# Patient Record
Sex: Female | Born: 1970
Health system: Southern US, Community
[De-identification: ages and names within clinical notes are randomized; demographics above are authoritative.]

## PROBLEM LIST (undated history)

## (undated) DIAGNOSIS — C801 Malignant (primary) neoplasm, unspecified: Secondary | ICD-10-CM

## (undated) DIAGNOSIS — G43909 Migraine, unspecified, not intractable, without status migrainosus: Secondary | ICD-10-CM

## (undated) DIAGNOSIS — J45909 Unspecified asthma, uncomplicated: Secondary | ICD-10-CM

---

## 1995-07-05 DIAGNOSIS — C801 Malignant (primary) neoplasm, unspecified: Secondary | ICD-10-CM

## 1995-07-05 HISTORY — DX: Malignant (primary) neoplasm, unspecified: C80.1

## 2003-07-05 HISTORY — PX: MINOR RELEASE DORSAL COMPARTMENT (DEQUERVAINS): SHX5980

## 2003-07-05 HISTORY — PX: OTHER SURGICAL HISTORY: SHX169

## 2004-07-04 HISTORY — PX: NASAL SINUS SURGERY: SHX719

## 2004-07-26 ENCOUNTER — Ambulatory Visit: Payer: Self-pay | Admitting: Orthopaedic Surgery

## 2004-10-03 ENCOUNTER — Emergency Department: Payer: Self-pay | Admitting: Emergency Medicine

## 2004-10-20 ENCOUNTER — Ambulatory Visit: Payer: Self-pay | Admitting: Orthopaedic Surgery

## 2005-03-09 ENCOUNTER — Ambulatory Visit: Payer: Self-pay | Admitting: Orthopaedic Surgery

## 2006-09-11 ENCOUNTER — Emergency Department: Payer: Self-pay | Admitting: Emergency Medicine

## 2006-12-18 ENCOUNTER — Emergency Department: Payer: Self-pay | Admitting: Emergency Medicine

## 2006-12-19 ENCOUNTER — Emergency Department: Payer: Self-pay | Admitting: Emergency Medicine

## 2008-03-11 ENCOUNTER — Emergency Department: Payer: Self-pay | Admitting: Emergency Medicine

## 2008-03-11 ENCOUNTER — Other Ambulatory Visit: Payer: Self-pay

## 2008-10-30 ENCOUNTER — Ambulatory Visit: Payer: Self-pay | Admitting: Family Medicine

## 2009-11-18 ENCOUNTER — Ambulatory Visit: Payer: Self-pay | Admitting: Family Medicine

## 2015-07-30 ENCOUNTER — Observation Stay: Payer: BLUE CROSS/BLUE SHIELD | Admitting: Certified Registered Nurse Anesthetist

## 2015-07-30 ENCOUNTER — Encounter
Admission: EM | Disposition: A | Discharge status: Home / Self Care | Payer: Self-pay | Source: Home / Self Care | Attending: Emergency Medicine

## 2015-07-30 ENCOUNTER — Observation Stay
Admission: EM | Admit: 2015-07-30 | Discharge: 2015-07-31 | Disposition: A | Discharge status: Home / Self Care | Payer: BLUE CROSS/BLUE SHIELD | Attending: Surgery | Admitting: Surgery

## 2015-07-30 ENCOUNTER — Emergency Department: Payer: BLUE CROSS/BLUE SHIELD

## 2015-07-30 DIAGNOSIS — Z9889 Other specified postprocedural states: Secondary | ICD-10-CM | POA: Insufficient documentation

## 2015-07-30 DIAGNOSIS — Z880 Allergy status to penicillin: Secondary | ICD-10-CM | POA: Insufficient documentation

## 2015-07-30 DIAGNOSIS — K829 Disease of gallbladder, unspecified: Secondary | ICD-10-CM | POA: Diagnosis not present

## 2015-07-30 DIAGNOSIS — Z888 Allergy status to other drugs, medicaments and biological substances status: Secondary | ICD-10-CM | POA: Insufficient documentation

## 2015-07-30 DIAGNOSIS — K8010 Calculus of gallbladder with chronic cholecystitis without obstruction: Secondary | ICD-10-CM | POA: Diagnosis not present

## 2015-07-30 DIAGNOSIS — F17200 Nicotine dependence, unspecified, uncomplicated: Secondary | ICD-10-CM | POA: Diagnosis not present

## 2015-07-30 DIAGNOSIS — K810 Acute cholecystitis: Secondary | ICD-10-CM | POA: Diagnosis present

## 2015-07-30 DIAGNOSIS — R1011 Right upper quadrant pain: Secondary | ICD-10-CM

## 2015-07-30 HISTORY — PX: CHOLECYSTECTOMY: SHX55

## 2015-07-30 HISTORY — DX: Migraine, unspecified, not intractable, without status migrainosus: G43.909

## 2015-07-30 LAB — URINALYSIS COMPLETE WITH MICROSCOPIC (ARMC ONLY)
Bilirubin Urine: NEGATIVE
GLUCOSE, UA: NEGATIVE mg/dL
Hgb urine dipstick: NEGATIVE
Ketones, ur: NEGATIVE mg/dL
Leukocytes, UA: NEGATIVE
NITRITE: NEGATIVE
Protein, ur: 30 mg/dL — AB
SPECIFIC GRAVITY, URINE: 1.025 (ref 1.005–1.030)
pH: 8 (ref 5.0–8.0)

## 2015-07-30 LAB — COMPREHENSIVE METABOLIC PANEL
ALBUMIN: 4.3 g/dL (ref 3.5–5.0)
ALK PHOS: 71 U/L (ref 38–126)
ALT: 17 U/L (ref 14–54)
AST: 22 U/L (ref 15–41)
Anion gap: 11 (ref 5–15)
BILIRUBIN TOTAL: 1.1 mg/dL (ref 0.3–1.2)
BUN: 20 mg/dL (ref 6–20)
CO2: 20 mmol/L — AB (ref 22–32)
Calcium: 9 mg/dL (ref 8.9–10.3)
Chloride: 105 mmol/L (ref 101–111)
Creatinine, Ser: 0.53 mg/dL (ref 0.44–1.00)
GFR calc Af Amer: >60 mL/min (ref >60)
GFR calc non Af Amer: >60 mL/min (ref >60)
Glucose, Bld: 134 mg/dL — ABNORMAL HIGH (ref 65–99)
Potassium: 3.5 mmol/L (ref 3.5–5.1)
Sodium: 136 mmol/L (ref 135–145)
TOTAL PROTEIN: 7.6 g/dL (ref 6.5–8.1)

## 2015-07-30 LAB — CBC
HEMATOCRIT: 42.7 % (ref 35.0–47.0)
Hemoglobin: 14.5 g/dL (ref 12.0–16.0)
MCH: 30.2 pg (ref 26.0–34.0)
MCHC: 34.1 g/dL (ref 32.0–36.0)
MCV: 88.7 fL (ref 80.0–100.0)
Platelets: 321 10*3/uL (ref 150–440)
RBC: 4.82 MIL/uL (ref 3.80–5.20)
RDW: 13.3 % (ref 11.5–14.5)
WBC: 13.9 10*3/uL — ABNORMAL HIGH (ref 3.6–11.0)

## 2015-07-30 LAB — PREGNANCY, URINE: Preg Test, Ur: NEGATIVE

## 2015-07-30 LAB — LIPASE, BLOOD: Lipase: 26 U/L (ref 11–51)

## 2015-07-30 SURGERY — LAPAROSCOPIC CHOLECYSTECTOMY
Anesthesia: General

## 2015-07-30 MED ORDER — HYDROMORPHONE HCL 1 MG/ML IJ SOLN
1.0000 mg | INTRAMUSCULAR | Status: DC | PRN
  Administered 2015-07-30 (×2): 1 mg via INTRAVENOUS
  Filled 2015-07-30 (×2): qty 1

## 2015-07-30 MED ORDER — ONDANSETRON 4 MG PO TBDP: 4.0000 mg | ORAL_TABLET | Freq: Three times a day (TID) | ORAL | Status: DC | PRN

## 2015-07-30 MED ORDER — ROCURONIUM BROMIDE 100 MG/10ML IV SOLN
INTRAVENOUS | Status: DC | PRN
  Administered 2015-07-30: 40 mg via INTRAVENOUS
  Administered 2015-07-30: 10 mg via INTRAVENOUS

## 2015-07-30 MED ORDER — IPRATROPIUM-ALBUTEROL 0.5-2.5 (3) MG/3ML IN SOLN
3.0000 mL | Freq: Once | RESPIRATORY_TRACT | Status: AC
  Administered 2015-07-30: 3 mL via RESPIRATORY_TRACT

## 2015-07-30 MED ORDER — OXYCODONE-ACETAMINOPHEN 5-325 MG PO TABS: 1.0000 | ORAL_TABLET | ORAL | Status: DC | PRN

## 2015-07-30 MED ORDER — LIDOCAINE HCL (CARDIAC) 20 MG/ML IV SOLN
INTRAVENOUS | Status: DC | PRN
  Administered 2015-07-30: 60 mg via INTRAVENOUS

## 2015-07-30 MED ORDER — LACTATED RINGERS IV SOLN
INTRAVENOUS | Status: DC
  Administered 2015-07-30 (×2): via INTRAVENOUS

## 2015-07-30 MED ORDER — HYDROMORPHONE HCL 1 MG/ML IJ SOLN
1.0000 mg | Freq: Once | INTRAMUSCULAR | Status: AC
  Administered 2015-07-30: 1 mg via INTRAVENOUS
  Filled 2015-07-30: qty 1

## 2015-07-30 MED ORDER — SODIUM CHLORIDE 0.9 % IJ SOLN
INTRAMUSCULAR | Status: AC
  Filled 2015-07-30: qty 50

## 2015-07-30 MED ORDER — PROMETHAZINE HCL 25 MG/ML IJ SOLN: 6.2500 mg | INTRAMUSCULAR | Status: DC | PRN

## 2015-07-30 MED ORDER — MORPHINE SULFATE (PF) 2 MG/ML IV SOLN
2.0000 mg | Freq: Once | INTRAVENOUS | Status: AC
  Administered 2015-07-30: 2 mg via INTRAVENOUS

## 2015-07-30 MED ORDER — ALBUTEROL SULFATE HFA 108 (90 BASE) MCG/ACT IN AERS
INHALATION_SPRAY | RESPIRATORY_TRACT | Status: DC | PRN
  Administered 2015-07-30 (×2): 4 via RESPIRATORY_TRACT

## 2015-07-30 MED ORDER — ONDANSETRON HCL 4 MG/2ML IJ SOLN
INTRAMUSCULAR | Status: AC
  Administered 2015-07-30: 4 mg via INTRAVENOUS
  Filled 2015-07-30: qty 2

## 2015-07-30 MED ORDER — MORPHINE SULFATE (PF) 2 MG/ML IV SOLN
INTRAVENOUS | Status: AC
  Filled 2015-07-30: qty 1

## 2015-07-30 MED ORDER — SUGAMMADEX SODIUM 500 MG/5ML IV SOLN
INTRAVENOUS | Status: DC | PRN
  Administered 2015-07-30: 350 mg via INTRAVENOUS

## 2015-07-30 MED ORDER — ONDANSETRON HCL 4 MG PO TABS: 4.0000 mg | ORAL_TABLET | Freq: Four times a day (QID) | ORAL | Status: DC | PRN

## 2015-07-30 MED ORDER — KCL IN DEXTROSE-NACL 10-5-0.45 MEQ/L-%-% IV SOLN
INTRAVENOUS | Status: DC
Start: 2015-07-30 — End: 2015-07-30
  Filled 2015-07-30 (×3): qty 1000

## 2015-07-30 MED ORDER — KCL IN DEXTROSE-NACL 10-5-0.45 MEQ/L-%-% IV SOLN
INTRAVENOUS | Status: DC
  Administered 2015-07-30 (×2): via INTRAVENOUS
  Filled 2015-07-30 (×3): qty 1000

## 2015-07-30 MED ORDER — FENTANYL CITRATE (PF) 100 MCG/2ML IJ SOLN
INTRAMUSCULAR | Status: AC
  Administered 2015-07-30: 25 ug via INTRAVENOUS
  Filled 2015-07-30: qty 2

## 2015-07-30 MED ORDER — OXYCODONE-ACETAMINOPHEN 5-325 MG PO TABS
1.0000 | ORAL_TABLET | ORAL | Status: DC | PRN
  Administered 2015-07-30 (×2): 2 via ORAL
  Administered 2015-07-31: 1 via ORAL
  Filled 2015-07-30: qty 2
  Filled 2015-07-30: qty 1
  Filled 2015-07-30: qty 2

## 2015-07-30 MED ORDER — CEFAZOLIN SODIUM 1-5 GM-% IV SOLN
1.0000 g | Freq: Four times a day (QID) | INTRAVENOUS | Status: AC
  Administered 2015-07-30 (×2): 1 g via INTRAVENOUS
  Filled 2015-07-30 (×2): qty 50

## 2015-07-30 MED ORDER — KETOROLAC TROMETHAMINE 30 MG/ML IJ SOLN
INTRAMUSCULAR | Status: DC | PRN
  Administered 2015-07-30: 30 mg via INTRAVENOUS

## 2015-07-30 MED ORDER — MIDAZOLAM HCL 5 MG/5ML IJ SOLN
INTRAMUSCULAR | Status: DC | PRN
  Administered 2015-07-30: 2 mg via INTRAVENOUS

## 2015-07-30 MED ORDER — BUPIVACAINE HCL (PF) 0.5 % IJ SOLN
INTRAMUSCULAR | Status: AC
  Filled 2015-07-30: qty 30

## 2015-07-30 MED ORDER — ONDANSETRON HCL 4 MG/2ML IJ SOLN
INTRAMUSCULAR | Status: DC | PRN
  Administered 2015-07-30: 4 mg via INTRAVENOUS

## 2015-07-30 MED ORDER — BUPIVACAINE HCL (PF) 0.5 % IJ SOLN
INTRAMUSCULAR | Status: DC | PRN
  Administered 2015-07-30: 18 mL

## 2015-07-30 MED ORDER — SODIUM CHLORIDE 0.9 % IJ SOLN
INTRAMUSCULAR | Status: AC
  Filled 2015-07-30: qty 3

## 2015-07-30 MED ORDER — ONDANSETRON HCL 4 MG/2ML IJ SOLN
4.0000 mg | Freq: Once | INTRAMUSCULAR | Status: AC
  Administered 2015-07-30: 4 mg via INTRAVENOUS
  Filled 2015-07-30: qty 2

## 2015-07-30 MED ORDER — PROPOFOL 10 MG/ML IV BOLUS
INTRAVENOUS | Status: DC | PRN
  Administered 2015-07-30: 150 mg via INTRAVENOUS

## 2015-07-30 MED ORDER — IPRATROPIUM-ALBUTEROL 0.5-2.5 (3) MG/3ML IN SOLN
RESPIRATORY_TRACT | Status: AC
  Filled 2015-07-30: qty 3

## 2015-07-30 MED ORDER — ONDANSETRON HCL 4 MG/2ML IJ SOLN: 4.0000 mg | Freq: Four times a day (QID) | INTRAMUSCULAR | Status: DC | PRN

## 2015-07-30 MED ORDER — GLYCOPYRROLATE 0.2 MG/ML IJ SOLN
INTRAMUSCULAR | Status: DC | PRN
  Administered 2015-07-30: 0.2 mg via INTRAVENOUS

## 2015-07-30 MED ORDER — MORPHINE SULFATE (PF) 2 MG/ML IV SOLN
2.0000 mg | Freq: Once | INTRAVENOUS | Status: AC
  Administered 2015-07-30: 2 mg via INTRAVENOUS
  Filled 2015-07-30: qty 1

## 2015-07-30 MED ORDER — ONDANSETRON HCL 4 MG/2ML IJ SOLN
4.0000 mg | Freq: Once | INTRAMUSCULAR | Status: AC
  Administered 2015-07-30: 4 mg via INTRAVENOUS

## 2015-07-30 MED ORDER — KETOROLAC TROMETHAMINE 30 MG/ML IJ SOLN
30.0000 mg | Freq: Four times a day (QID) | INTRAMUSCULAR | Status: DC
  Administered 2015-07-30 – 2015-07-31 (×2): 30 mg via INTRAVENOUS
  Filled 2015-07-30 (×8): qty 1

## 2015-07-30 MED ORDER — FENTANYL CITRATE (PF) 100 MCG/2ML IJ SOLN
25.0000 ug | INTRAMUSCULAR | Status: DC | PRN
  Administered 2015-07-30 (×4): 25 ug via INTRAVENOUS

## 2015-07-30 MED ORDER — FENTANYL CITRATE (PF) 100 MCG/2ML IJ SOLN
INTRAMUSCULAR | Status: DC | PRN
  Administered 2015-07-30: 100 ug via INTRAVENOUS
  Administered 2015-07-30 (×2): 50 ug via INTRAVENOUS

## 2015-07-30 MED ORDER — HEPARIN SODIUM (PORCINE) 5000 UNIT/ML IJ SOLN
5000.0000 [IU] | Freq: Three times a day (TID) | INTRAMUSCULAR | Status: DC
  Administered 2015-07-30 – 2015-07-31 (×3): 5000 [IU] via SUBCUTANEOUS
  Filled 2015-07-30 (×3): qty 1

## 2015-07-30 MED ORDER — CEFAZOLIN SODIUM 1-5 GM-% IV SOLN
1.0000 g | Freq: Four times a day (QID) | INTRAVENOUS | Status: DC
  Administered 2015-07-30 (×2): 1 g via INTRAVENOUS
  Filled 2015-07-30 (×4): qty 50

## 2015-07-30 SURGICAL SUPPLY — 36 items
APPLIER CLIP 5 13 M/L LIGAMAX5 (MISCELLANEOUS) ×3
CANISTER SUCT 1200ML W/VALVE (MISCELLANEOUS) ×3 IMPLANT
CATH CHOLANGI 4FR 420404F (CATHETERS) ×3 IMPLANT
CHLORAPREP W/TINT 26ML (MISCELLANEOUS) ×3 IMPLANT
CLIP APPLIE 5 13 M/L LIGAMAX5 (MISCELLANEOUS) ×1 IMPLANT
CONRAY 60ML FOR OR (MISCELLANEOUS) ×3 IMPLANT
DEFOGGER SCOPE WARMER CLEARIFY (MISCELLANEOUS) ×3 IMPLANT
ELECT E-Z MONOPOLAR 33 (MISCELLANEOUS) ×3
ELECT REM PT RETURN 9FT ADLT (ELECTROSURGICAL) ×3
ELECTRODE E-Z MONOPOLAR 33 (MISCELLANEOUS) ×1 IMPLANT
ELECTRODE REM PT RTRN 9FT ADLT (ELECTROSURGICAL) ×1 IMPLANT
ENDOPOUCH RETRIEVER 10 (MISCELLANEOUS) ×3 IMPLANT
GLOVE PI ORTHOPRO 6.5 (GLOVE) ×2
GLOVE PI ORTHOPRO STRL 6.5 (GLOVE) ×1 IMPLANT
GOWN STRL REUS W/ TWL LRG LVL3 (GOWN DISPOSABLE) ×3 IMPLANT
GOWN STRL REUS W/TWL LRG LVL3 (GOWN DISPOSABLE) ×6
IRRIGATION STRYKERFLOW (MISCELLANEOUS) ×1 IMPLANT
IRRIGATOR STRYKERFLOW (MISCELLANEOUS) ×3
IV CATH ANGIO 12GX3 LT BLUE (NEEDLE) ×3 IMPLANT
IV NS 1000ML (IV SOLUTION) ×2
IV NS 1000ML BAXH (IV SOLUTION) ×1 IMPLANT
LABEL OR SOLS (LABEL) ×3 IMPLANT
LIQUID BAND (GAUZE/BANDAGES/DRESSINGS) ×3 IMPLANT
NEEDLE HYPO 25X1 1.5 SAFETY (NEEDLE) ×3 IMPLANT
NS IRRIG 500ML POUR BTL (IV SOLUTION) ×3 IMPLANT
PACK LAP CHOLECYSTECTOMY (MISCELLANEOUS) ×3 IMPLANT
PENCIL ELECTRO HAND CTR (MISCELLANEOUS) ×3 IMPLANT
SCISSORS METZENBAUM CVD 33 (INSTRUMENTS) ×3 IMPLANT
SLEEVE ENDOPATH XCEL 5M (ENDOMECHANICALS) ×6 IMPLANT
SUT MNCRL 4-0 (SUTURE) ×2
SUT MNCRL 4-0 27XMFL (SUTURE) ×1
SUT VICRYL 0 AB UR-6 (SUTURE) ×6 IMPLANT
SUTURE MNCRL 4-0 27XMF (SUTURE) ×1 IMPLANT
TROCAR XCEL BLUNT TIP 100MML (ENDOMECHANICALS) ×3 IMPLANT
TROCAR XCEL NON-BLD 5MMX100MML (ENDOMECHANICALS) ×3 IMPLANT
TUBING INSUFFLATOR HI FLOW (MISCELLANEOUS) ×3 IMPLANT

## 2015-07-30 NOTE — Interval H&P Note (Signed)
History and Physical Interval Note:  07/30/2015 11:14 AM  Sydney Wilson  has presented today for surgery, with the diagnosis of cholecystitis  The various methods of treatment have been discussed with the patient and family. After consideration of risks, benefits and other options for treatment, the patient has consented to  Procedure(s): LAPAROSCOPIC CHOLECYSTECTOMY (N/A) as a surgical intervention .  The patient's history has been reviewed, patient examined, no change in status, stable for surgery.  I have reviewed the patient's chart and labs.  Questions were answered to the patient's satisfaction.     Catherine L Loflin

## 2015-07-30 NOTE — Op Note (Addendum)
Laparoscopic Cholecystectomy Procedure Note  Indications: This patient presents with symptomatic gallbladder disease and will undergo laparoscopic cholecystectomy.  Pre-operative Diagnosis: Calculus of gallbladder with acute cholecystitis, without mention of obstruction  Post-operative Diagnosis: Same  Surgeon:  Susa Griffins, MD  Assistants: Franki Monte, MD  Anesthesia: General endotracheal anesthesia  ASA Class: 2  Procedure Details  The patient was seen again in the Holding Room. The risks, benefits, complications, treatment options, and expected outcomes were discussed with the patient. The possibilities of reaction to medication, pulmonary aspiration, perforation of viscus, bleeding, recurrent infection, finding a normal gallbladder, the need for additional procedures, failure to diagnose a condition, the possible need to convert to an open procedure, and creating a complication requiring transfusion or operation were discussed with the patient. The patient and/or family concurred with the proposed plan, giving informed consent. The site of surgery properly noted/marked. The patient was taken to Operating Room, identified as Sydney Wilson and the procedure verified as Laparoscopic Cholecystectomy.   A Time Out was held and the above information confirmed.  Prior to the induction of general anesthesia, antibiotic prophylaxis was administered. General endotracheal anesthesia was then administered and tolerated well. After the induction, the abdomen was prepped in the usual sterile fashion. The patient was positioned in the supine position with the left arm comfortably tucked, along with some reverse Trendelenburg.  Local anesthetic agent was injected into the skin near the umbilicus and an incision made. The midline fascia was incised and the Hasson technique was used to introduce a 10 mm port under direct vision. It was secured with two  Vicryl stay sutures placed in the usual fashion.  Pneumoperitoneum was then created with CO2 and tolerated well without any adverse changes in the patient's vital signs. Additional trocars were introduced under direct vision. All skin incisions were infiltrated with a local anesthetic agent before making the incision and placing the trocars.   The gallbladder was identified, the fundus grasped and retracted cephalad. Adhesions were lysed bluntly and with the electrocautery where indicated, taking care not to injure any adjacent organs or viscus. The infundibulum was grasped and retracted laterally, exposing the peritoneum overlying the triangle of Calot. This was then divided and exposed in a blunt fashion. The cystic duct was clearly identified and bluntly dissected circumferentially. The junctions of the gallbladder, cystic duct and common bile duct were clearly identified prior to the division of any linear structure.   The cystic duct was then doubly ligated with surgical clips on the patient side and singly clipped on the gallbladder side and divided. The cystic artery was identified, dissected free, ligated with clips and divided as well.   The gallbladder was dissected from the liver bed in retrograde fashion with the electrocautery. The gallbladder was removed. The liver bed was irrigated and inspected. Hemostasis was achieved with the electrocautery. Copious irrigation was utilized and was repeatedly aspirated until clear all particulate matter.  Pneumoperitoneum was completely reduced after viewing removal of the trocars under direct vision. The wound was thoroughly irrigated and the fascia was then closed with a figure of eight suture; the skin was then closed with 4-0 Monocryl and a sterile glue was applied.  Instrument, sponge, and needle counts were correct at closure and at the conclusion of the case.   Findings: Cholecystitis with Cholelithiasis  Estimated Blood Loss: Minimal         Drains: none         Total IV Fluids: 1062mL  Specimens: Gallbladder           Complications: None; patient tolerated the procedure well.         Disposition: PACU - hemodynamically stable.         Condition: stable

## 2015-07-30 NOTE — ED Provider Notes (Signed)
[facility] Emergency Department Provider Note  ____________________________________________  Time seen: 1:45AM  I have reviewed the triage vital signs and the nursing notes.   HISTORY  Chief Complaint Abdominal Pain      HPI Sydney Wilson is a 45 y.o. female presents with acute onset of upper abdominal pain at 11:00 PM. Patient states current pain score is 8 out of 10. Patient admits to one episode of nonbloody emesis. Patient denies any diarrhea. Patient denies any fever. Patient admits to a tubal ligation as her only surgery in the past. No family history of gallbladder disease.     Past medical history No pertinent past medical history There are no active problems to display for this patient.   Past Surgical History  Procedure Laterality Date  . Carpel tunnel release    . Minor release dorsal compartment (dequervains) Right     No current outpatient prescriptions on file.  Allergies Prednisone; Avelox; and Penicillins  No family history on file.  Social History Social History  Substance Use Topics  . Smoking status: Current Every Day Smoker  . Smokeless tobacco: None  . Alcohol Use: No    Review of Systems  Constitutional: Negative for fever. Eyes: Negative for visual changes. ENT: Negative for sore throat. Cardiovascular: Negative for chest pain. Respiratory: Negative for shortness of breath. Gastrointestinal: Positive for abdominal pain and vomiting Genitourinary: Negative for dysuria. Musculoskeletal: Negative for back pain. Skin: Negative for rash. Neurological: Negative for headaches, focal weakness or numbness.  10-point ROS otherwise negative.  ____________________________________________   PHYSICAL EXAM:  VITAL SIGNS: ED Triage Vitals  Enc Vitals Group     BP 07/30/15 0132 121/80 mmHg     Pulse Rate 07/30/15 0132 88     Resp 07/30/15 0132 18     Temp 07/30/15 0132 97.7 F (36.5 C)     Temp Source 07/30/15  0132 Oral     SpO2 07/30/15 0132 98 %     Weight 07/30/15 0132 169 lb (76.658 kg)     Height 07/30/15 0132 5\' 2"  (1.575 m)     Head Cir --      Peak Flow --      Pain Score 07/30/15 0133 10     Pain Loc --      Pain Edu? --      Excl. in [place]? --      Constitutional: Alert and oriented. Well appearing and in no distress. Eyes: Conjunctivae are normal. PERRL. Normal extraocular movements. ENT   Head: Normocephalic and atraumatic.   Nose: No congestion/rhinnorhea.   Mouth/Throat: Mucous membranes are moist.   Neck: No stridor. Hematological/Lymphatic/Immunilogical: No cervical lymphadenopathy. Cardiovascular: Normal rate, regular rhythm. Normal and symmetric distal pulses are present in all extremities. No murmurs, rubs, or gallops. Respiratory: Normal respiratory effort without tachypnea nor retractions. Breath sounds are clear and equal bilaterally. No wheezes/rales/rhonchi. Gastrointestinal: Right upper quadrant pain with palpation No distention. There is no CVA tenderness. Genitourinary: deferred Musculoskeletal: Nontender with normal range of motion in all extremities. No joint effusions.  No lower extremity tenderness nor edema. Neurologic:  Normal speech and language. No gross focal neurologic deficits are appreciated. Speech is normal.  Skin:  Skin is warm, dry and intact. No rash noted. Psychiatric: Mood and affect are normal. Speech and behavior are normal. Patient exhibits appropriate insight and judgment.  ____________________________________________    LABS (pertinent positives/negatives)  Labs Reviewed  COMPREHENSIVE METABOLIC PANEL - Abnormal; Notable for the following:  CO2 20 (*)    Glucose, Bld 134 (*)    All other components within normal limits  CBC - Abnormal; Notable for the following:    WBC 13.9 (*)    All other components within normal limits  LIPASE, BLOOD  URINALYSIS COMPLETEWITH MICROSCOPIC ([facility] ONLY)      RADIOLOGY  US  Abdomen Limited RUQ (Final result) Result time: 07/30/15 03:35:28   Final result by Rad Results In Interface (07/30/15 03:35:28)   Narrative:   CLINICAL DATA: Right upper quadrant pain for several hours.  EXAM: US ABDOMEN LIMITED - RIGHT UPPER QUADRANT  COMPARISON: None.  FINDINGS: Gallbladder:  Cholelithiasis with a large gallstone measuring up to 2 cm diameter. No gallbladder wall thickening, edema, or sludge. Murphy's sign is positive however.  Common bile duct:  Diameter: 5 mm, normal  Liver:  No focal lesion identified. Within normal limits in parenchymal echogenicity.  IMPRESSION: Cholelithiasis with positive Murphy's sign. This can be seen with acute cholecystitis in the appropriate clinical setting.   Electronically Signed By: Lucienne Capers M.D. On: 07/30/2015 03:35            ____________________________________________   INITIAL IMPRESSION / ASSESSMENT AND PLAN / ED COURSE  Pertinent labs & imaging results that were available during my care of the patient were reviewed by me and considered in my medical decision making (see chart for details).  Patient sees IV morphine and Zofran for analgesia and antiemetic respectively. Pain controlled at this time. Patient advised to return to emergency department if pain were to worsen fever jaundice or vomiting  ____________________________________________   FINAL CLINICAL IMPRESSION(S) / ED DIAGNOSES  Final diagnoses:  RUQ pain  Acute cholecystitis      Gregor Hams, MD 07/30/15 [phone]

## 2015-07-30 NOTE — Anesthesia Procedure Notes (Signed)
Procedure Name: Intubation Date/Time: 07/30/2015 12:00 PM Performed by: Doreen Salvage Pre-anesthesia Checklist: Patient identified, Patient being monitored, Timeout performed, Emergency Drugs available and Suction available Patient Re-evaluated:Patient Re-evaluated prior to inductionOxygen Delivery Method: Circle system utilized Preoxygenation: Pre-oxygenation with 100% oxygen Intubation Type: IV induction Ventilation: Mask ventilation without difficulty Laryngoscope Size: Mac and 3 Grade View: Grade I Tube type: Oral Tube size: 7.0 mm Number of attempts: 1 Airway Equipment and Method: Stylet Placement Confirmation: ETT inserted through vocal cords under direct vision,  positive ETCO2 and breath sounds checked- equal and bilateral Secured at: 21 cm Tube secured with: Tape Dental Injury: Teeth and Oropharynx as per pre-operative assessment  Comments: Poor dentition- same as pre-op

## 2015-07-30 NOTE — ED Notes (Signed)
Attempted to call report again; was told they are having an emergency and they will call back.

## 2015-07-30 NOTE — ED Notes (Signed)
MD at bedside to discus results with Pt.

## 2015-07-30 NOTE — Anesthesia Preprocedure Evaluation (Signed)
Anesthesia Evaluation  Patient identified by MRN, date of birth, ID band Patient awake    Reviewed: Allergy & Precautions, H&P , NPO status , Patient's Chart, lab work & pertinent test results, reviewed documented beta blocker date and time   History of Anesthesia Complications Negative for: history of anesthetic complications  Airway Mallampati: III  TM Distance: >3 FB Neck ROM: full    Dental no notable dental hx. (+) Missing, Poor Dentition   Pulmonary neg shortness of breath, neg sleep apnea, neg COPD, Recent URI , Residual Cough, Current Smoker,    Pulmonary exam normal breath sounds clear to auscultation       Cardiovascular Exercise Tolerance: Good negative cardio ROS Normal cardiovascular exam Rhythm:regular Rate:Normal     Neuro/Psych  Headaches, neg Seizures negative psych ROS   GI/Hepatic negative GI ROS, Neg liver ROS,   Endo/Other  negative endocrine ROS  Renal/GU negative Renal ROS  negative genitourinary   Musculoskeletal   Abdominal   Peds  Hematology negative hematology ROS (+)   Anesthesia Other Findings Past Medical History:   Migraines                                                    Reproductive/Obstetrics negative OB ROS                             Anesthesia Physical Anesthesia Plan  ASA: II  Anesthesia Plan: General   Post-op Pain Management:    Induction:   Airway Management Planned:   Additional Equipment:   Intra-op Plan:   Post-operative Plan:   Informed Consent: I have reviewed the patients History and Physical, chart, labs and discussed the procedure including the risks, benefits and alternatives for the proposed anesthesia with the patient or authorized representative who has indicated his/her understanding and acceptance.   Dental Advisory Given  Plan Discussed with: Anesthesiologist, CRNA and Surgeon  Anesthesia Plan Comments:          Anesthesia Quick Evaluation

## 2015-07-30 NOTE — ED Notes (Signed)
Attempted to call report to 1A; was told nurse in isolation room and would call back

## 2015-07-30 NOTE — H&P (Signed)
Sydney Wilson is an 45 y.o. female.    Chief Complaint: ruq pain  HPI: This a patient with approximately 12 hours of abdominal pain that started after a fatty meal last night she's never had an episode like this before she's had multiple emesis and severe right upper quadrant pain radiating through to her back and shoulder. She denies fevers or chills denies jaundice or acholic stools infected or normal bowel movement last night. She has required IV pain medication in the emergency room for discomfort. Patient is employed smokes tobacco does not drink alcohol has no family history for gallstones  History reviewed. No pertinent past medical history.  Past Surgical History  Procedure Laterality Date  . Carpel tunnel release    . Minor release dorsal compartment (dequervains) Right     No family history on file. Social History:  reports that she has been smoking.  She does not have any smokeless tobacco history on file. She reports that she does not drink alcohol. Her drug history is not on file.  Allergies:  Allergies  Allergen Reactions  . Prednisone Shortness Of Breath  . Avelox [Moxifloxacin Hcl In Nacl] Rash  . Penicillins Rash     (Not in a hospital admission)   Review of Systems  Constitutional: Negative for fever and chills.  HENT: Negative.   Eyes: Negative.   Respiratory: Negative.   Cardiovascular: Negative.   Gastrointestinal: Positive for nausea, vomiting and abdominal pain. Negative for heartburn, diarrhea, constipation, blood in stool and melena.  Genitourinary: Negative.   Musculoskeletal: Negative.   Skin: Negative.   Neurological: Negative.   Endo/Heme/Allergies: Negative.   Psychiatric/Behavioral: Negative.      Physical Exam:  BP 115/75 mmHg  Pulse 94  Temp(Src) 97.7 F (36.5 C) (Oral)  Resp 18  Ht '5\' 2"'$  (1.575 m)  Wt 169 lb (76.658 kg)  BMI 30.90 kg/m2  SpO2 94%  LMP 07/27/2015  Physical Exam  Constitutional: She is oriented to person,  place, and time. She appears distressed.  Appears uncomfortable writhing  HENT:  Head: Normocephalic and atraumatic.  Eyes: Pupils are equal, round, and reactive to light. Right eye exhibits no discharge. Left eye exhibits no discharge. No scleral icterus.  Neck: Normal range of motion.  Cardiovascular: Normal rate, regular rhythm and normal heart sounds.   Pulmonary/Chest: Effort normal and breath sounds normal. No respiratory distress. She has no wheezes.  Abdominal: Soft. She exhibits no distension. There is tenderness. There is no rebound and no guarding.  Tenderness in the right upper quadrant with positive Murphy sign  Musculoskeletal: Normal range of motion. She exhibits no edema.  Lymphadenopathy:    She has no cervical adenopathy.  Neurological: She is alert and oriented to person, place, and time.  Skin: Skin is warm and dry. She is not diaphoretic.  Psychiatric: Mood and affect normal.  Vitals reviewed.       Results for orders placed or performed during the hospital encounter of 07/30/15 (from the past 48 hour(s))  Lipase, blood     Status: None   Collection Time: 07/30/15  1:39 AM  Result Value Ref Range   Lipase 26 11 - 51 U/L  Comprehensive metabolic panel     Status: Abnormal   Collection Time: 07/30/15  1:39 AM  Result Value Ref Range   Sodium 136 135 - 145 mmol/L   Potassium 3.5 3.5 - 5.1 mmol/L   Chloride 105 101 - 111 mmol/L   CO2 20 (L) 22 - 32  mmol/L   Glucose, Bld 134 (H) 65 - 99 mg/dL   BUN 20 6 - 20 mg/dL   Creatinine, Ser 0.53 0.44 - 1.00 mg/dL   Calcium 9.0 8.9 - 10.3 mg/dL   Total Protein 7.6 6.5 - 8.1 g/dL   Albumin 4.3 3.5 - 5.0 g/dL   AST 22 15 - 41 U/L   ALT 17 14 - 54 U/L   Alkaline Phosphatase 71 38 - 126 U/L   Total Bilirubin 1.1 0.3 - 1.2 mg/dL   GFR calc non Af Amer >60 >60 mL/min   GFR calc Af Amer >60 >60 mL/min    Comment: (NOTE) The eGFR has been calculated using the CKD EPI equation. This calculation has not been validated in  all clinical situations. eGFR's persistently <60 mL/min signify possible Chronic Kidney Disease.    Anion gap 11 5 - 15  CBC     Status: Abnormal   Collection Time: 07/30/15  1:39 AM  Result Value Ref Range   WBC 13.9 (H) 3.6 - 11.0 K/uL   RBC 4.82 3.80 - 5.20 MIL/uL   Hemoglobin 14.5 12.0 - 16.0 g/dL   HCT 42.7 35.0 - 47.0 %   MCV 88.7 80.0 - 100.0 fL   MCH 30.2 26.0 - 34.0 pg   MCHC 34.1 32.0 - 36.0 g/dL   RDW 13.3 11.5 - 14.5 %   Platelets 321 150 - 440 K/uL   US Abdomen Limited Ruq  07/30/2015  CLINICAL DATA:  Right upper quadrant pain for several hours. EXAM: US ABDOMEN LIMITED - RIGHT UPPER QUADRANT COMPARISON:  None. FINDINGS: Gallbladder: Cholelithiasis with a large gallstone measuring up to 2 cm diameter. No gallbladder wall thickening, edema, or sludge. Murphy's sign is positive however. Common bile duct: Diameter: 5 mm, normal Liver: No focal lesion identified. Within normal limits in parenchymal echogenicity. IMPRESSION: Cholelithiasis with positive Murphy's sign. This can be seen with acute cholecystitis in the appropriate clinical setting. Electronically Signed   By: Lucienne Capers M.D.   On: 07/30/2015 03:35     Assessment/Plan  Labs and studies personally reviewed patient appears to have acute cholecystitis I recommended admission to the hospital and starting IV antibiotics IV fluids. I discussed with her the rationale for offering surgery at this point because of her unrelenting pain and nausea and vomiting and the risks of bleeding infection recurrence of symptoms failure to resolve her symptoms (procedure bile duct damage bile duct leak retained common bile duct stone any of which could require further surgery and/or ERCP stent and papillotomy she understood and agreed to proceed her family was present for this discussion. Dr. Azalee Course would be doing the procedure  Florene Glen, MD, FACS

## 2015-07-30 NOTE — ED Notes (Signed)
Pt is vomiting, Dr. Owens Shark notified and medication was ordered.

## 2015-07-30 NOTE — Transfer of Care (Signed)
Immediate Anesthesia Transfer of Care Note  Patient: Sydney Wilson  Procedure(s) Performed: Procedure(s): LAPAROSCOPIC CHOLECYSTECTOMY (N/A)  Patient Location: PACU  Anesthesia Type:General  Level of Consciousness: sedated  Airway & Oxygen Therapy: Patient Spontanous Breathing and Patient connected to face mask oxygen  Post-op Assessment: Report given to RN and Post -op Vital signs reviewed and stable  Post vital signs: Reviewed and stable  Last Vitals:  Filed Vitals:   07/30/15 1116 07/30/15 1314  BP: 121/75 122/73  Pulse: 72 85  Temp: 35.9 C 36.9 C  Resp: 18 14    Complications: No apparent anesthesia complications

## 2015-07-30 NOTE — ED Notes (Signed)
Patient was awakened around 2300 with sudden onset of abdominal pain. Points to umbilical area and then states it radiated upwards. Felt like she needed to vomit but couldn't get anything to come up. States a little "rough breathing" at the time it started but denies diaphoresis, denies diarrhea stating that she "can't even use the bathroom." Patient is comfortable while in triage. Able to answer questions appropriately. No obvious respiratory distress.

## 2015-07-30 NOTE — Progress Notes (Signed)
To surgery  For lap chole. Family at bedside

## 2015-07-30 NOTE — OR Nursing (Signed)
Clarified with Dr Azalee Course. MD wants Ancef 1gm iv started preop.

## 2015-07-31 LAB — SURGICAL PATHOLOGY

## 2015-07-31 MED ORDER — OXYCODONE-ACETAMINOPHEN 5-325 MG PO TABS: 1.0000 | ORAL_TABLET | ORAL | Status: DC | PRN

## 2015-07-31 NOTE — Discharge Instructions (Addendum)
Cholelithiasis Cholelithiasis (also called gallstones) is a form of gallbladder disease in which gallstones form in your gallbladder. The gallbladder is an organ that stores bile made in the liver, which helps digest fats. Gallstones begin as small crystals and slowly grow into stones. Gallstone pain occurs when the gallbladder spasms and a gallstone is blocking the duct. Pain can also occur when a stone passes out of the duct.  RISK FACTORS  Being female.   Having multiple pregnancies. Health care providers sometimes advise removing diseased gallbladders before future pregnancies.   Being obese.  Eating a diet heavy in fried foods and fat.   Being older than 25 years and increasing age.   Prolonged use of medicines containing female hormones.   Having diabetes mellitus.   Rapidly losing weight.   Having a family history of gallstones (heredity).  SYMPTOMS  Nausea.   Vomiting.  Abdominal pain.   Yellowing of the skin (jaundice).   Sudden pain. It may persist from several minutes to several hours.  Fever.   Tenderness to the touch. In some cases, when gallstones do not move into the bile duct, people have no pain or symptoms. These are called "silent" gallstones.  TREATMENT Silent gallstones do not need treatment. In severe cases, emergency surgery may be required. Options for treatment include:  Surgery to remove the gallbladder. This is the most common treatment.  Medicines. These do not always work and may take 6-12 months or more to work.  Shock wave treatment (extracorporeal biliary lithotripsy). In this treatment an ultrasound machine sends shock waves to the gallbladder to break gallstones into smaller pieces that can pass into the intestines or be dissolved by medicine. HOME CARE INSTRUCTIONS   Only take over-the-counter or prescription medicines for pain, discomfort, or fever as directed by your health care provider.   Follow a low-fat diet until  seen again by your health care provider. Fat causes the gallbladder to contract, which can result in pain.   Follow up with your health care provider as directed. Attacks are almost always recurrent and surgery is usually required for permanent treatment.  SEEK IMMEDIATE MEDICAL CARE IF:   Your pain increases and is not controlled by medicines.   You have a fever or persistent symptoms for more than 2-3 days.   You have a fever and your symptoms suddenly get worse.   You have persistent nausea and vomiting.  MAKE SURE YOU:   Understand these instructions.  Will watch your condition.  Will get help right away if you are not doing well or get worse.   This information is not intended to replace advice given to you by your health care provider. Make sure you discuss any questions you have with your health care provider.   Document Released: 06/16/2005 Document Revised: 02/20/2013 Document Reviewed: 12/12/2012 Elsevier Interactive Patient Education 2016 [place] After Refer to this sheet in the next few weeks. These instructions provide you with information about caring for yourself after your procedure. Your health care provider may also give you more specific instructions. Your treatment has been planned according to current medical practices, but problems sometimes occur. Call your health care provider if you have any problems or questions after your procedure. WHAT TO EXPECT AFTER THE PROCEDURE After your procedure, it is common to have soreness near the site of your drainage tube (catheter) or your incision. HOME CARE INSTRUCTIONS Incision Care  Follow instructions from your health care provider about how to take  care of your incision. Make sure you:  Wash your hands with soap and water before you change your bandage (dressing). If soap and water are not available, use hand sanitizer.  Change your dressing as told by your health care  provider.  Leave stitches (sutures), skin glue, or adhesive strips in place. These skin closures may need to be in place for 2 weeks or longer. If adhesive strip edges start to loosen and curl up, you may trim the loose edges. Do not remove adhesive strips completely unless your health care provider tells you to do that.  Check your incision and your drainage site every day for signs of infection. Watch for:  Redness, swelling, or pain.  Fluid, blood, or pus. General Instructions  If you were sent home with a surgical drain in place, follow instructions from your health care provider about how to care for your drain and collection bag at home.  Do not take baths, swim, or use a hot tub until your health care provider approves. Ask your health care provider if you can take showers. You may only be allowed to take sponge baths for bathing.  Follow instructions from your health care provider about what you may eat or drink.  Take over-the-counter and prescription medicines only as told by your health care provider.  Keep all follow-up visits as told by your health care provider. This is important. SEEK MEDICAL CARE IF:  You have redness, swelling, or pain at your incision or drainage site.  You have nausea or vomiting. SEEK IMMEDIATE MEDICAL CARE IF:  Your abdominal pain gets worse.  You feel dizzy or you faint while standing.  You have fluid, blood, or pus coming from your incision or drainage site.  You have a fever.  You have shortness of breath.  You have a rapid heartbeat.  Your nausea or vomiting does not go away.  Your drainage tube becomes blocked.  Your drainage tube comes out of your abdomen.   This information is not intended to replace advice given to you by your health care provider. Make sure you discuss any questions you have with your health care provider.   Document Released: 03/11/2015 Document Reviewed: 10/01/2014 Elsevier Interactive Patient Education  [employer].

## 2015-07-31 NOTE — Discharge Summary (Signed)
Physician Discharge Summary   Patient ID: Sydney Wilson [id number] y.o. 09-14-1970  Admit date: 07/30/2015  Discharge date and time: 07/31/2015   Admitting Physician: Florene Glen, MD   Discharge Physician: Caroleen Hamman, MD FACS  Admission Diagnoses: Acute cholecystitis [K81.0] RUQ pain [R10.11]  Discharge Diagnoses: Same  Admission Condition: fair  Discharged Condition: good  Indication for Admission: Acute cholecystitis  Hospital Course: 45 yo female admitted for RUQ, U/S showed calculus cholecystitis. She underwent an uneventful laparoscopic cholecystectomy and was kept overnight for observation. At the time of DC she was tolerating PO, ambulating her abdomen was soft, incisions c/d/i. Her Vitals were stable and she was in good condition.  Discharge Exam: See above  Disposition: Final discharge disposition not confirmed  Patient Instructions:    Medication List    TAKE these medications        aspirin-acetaminophen-caffeine 250-250-65 MG tablet  Commonly known as:  EXCEDRIN MIGRAINE  Take 1 tablet by mouth every 6 (six) hours as needed for headache.     ondansetron 4 MG disintegrating tablet  Commonly known as:  ZOFRAN ODT  Take 1 tablet (4 mg total) by mouth every 8 (eight) hours as needed for nausea or vomiting.     oxyCODONE-acetaminophen 5-325 MG tablet  Commonly known as:  ROXICET  Take 1 tablet by mouth every 4 (four) hours as needed for severe pain.     oxyCODONE-acetaminophen 5-325 MG tablet  Commonly known as:  PERCOCET/ROXICET  Take 1-2 tablets by mouth every 4 (four) hours as needed for moderate pain.       Activity: activity as tolerated and no driving for today and no lifting, driving, or strenuous exercise for 6 weeks Diet: low fat, low cholesterol diet Wound Care: none needed  Follow-up with [facility] surgical in 2 week.  Signed: Diego F Pabon 07/31/2015 9:27 AM

## 2015-07-31 NOTE — Anesthesia Postprocedure Evaluation (Signed)
Anesthesia Post Note  Patient: Sydney Wilson  Procedure(s) Performed: Procedure(s) (LRB): LAPAROSCOPIC CHOLECYSTECTOMY (N/A)  Patient location during evaluation: PACU Anesthesia Type: General Level of consciousness: awake and alert Pain management: pain level controlled Vital Signs Assessment: post-procedure vital signs reviewed and stable Respiratory status: spontaneous breathing, nonlabored ventilation, respiratory function stable and patient connected to nasal cannula oxygen Cardiovascular status: blood pressure returned to baseline and stable Postop Assessment: no signs of nausea or vomiting Anesthetic complications: no    Last Vitals:  Filed Vitals:   07/31/15 0505 07/31/15 0740  BP: 101/55 121/63  Pulse: 86 92  Temp: 37.3 C 37.2 C  Resp: 18 18    Last Pain:  Filed Vitals:   07/31/15 0748  PainSc: 8                  Martha Clan

## 2015-08-03 ENCOUNTER — Telehealth: Payer: Self-pay | Admitting: Surgery

## 2015-08-03 NOTE — Telephone Encounter (Signed)
Returned phone call to patient at this time. Patient has concerns regarding constipation. Informed patient that they should increase water intake and activity level as much as possible to help with bowel movement. Also educated that they may use Miralax over the counter x 2 doses, 6 hours apart, followed by Dulcolax x 2 doses, 6 hours apart until they have a successful bowel movement. Patient has some rectal pressure at this time, explained that she may want to try a Fleets enema x 2 doses prior to beginning laxatives as this may relieve the stool burden in the colon quicker than the laxatives orally. Asked patient to call back for further instructions if after taking both doses of these medications, they are still unsuccessful with a bowel movement.  Patient verbalizes understanding of this information.  Denies any other questions or concerns.  Confirmed post-op appointment information. Encouraged patient to call back with any further questions or concerns prior to appointment.

## 2015-08-03 NOTE — Telephone Encounter (Signed)
Patient is in a lot of pain. No bowel movement since last Tuesday. Only taking oxcycodient and its not helping. Patients sister said she isn't eating well. Please call.

## 2015-08-04 ENCOUNTER — Inpatient Hospital Stay
Admission: EM | Admit: 2015-08-04 | Discharge: 2015-08-07 | DRG: 445 | Disposition: A | Discharge status: Home / Self Care | Payer: BLUE CROSS/BLUE SHIELD | Attending: Surgery | Admitting: Surgery

## 2015-08-04 ENCOUNTER — Encounter: Payer: Self-pay | Admitting: Urgent Care

## 2015-08-04 DIAGNOSIS — K567 Ileus, unspecified: Secondary | ICD-10-CM | POA: Diagnosis present

## 2015-08-04 DIAGNOSIS — K259 Gastric ulcer, unspecified as acute or chronic, without hemorrhage or perforation: Secondary | ICD-10-CM | POA: Diagnosis present

## 2015-08-04 DIAGNOSIS — Z79899 Other long term (current) drug therapy: Secondary | ICD-10-CM

## 2015-08-04 DIAGNOSIS — R109 Unspecified abdominal pain: Secondary | ICD-10-CM | POA: Diagnosis not present

## 2015-08-04 DIAGNOSIS — Z9889 Other specified postprocedural states: Secondary | ICD-10-CM

## 2015-08-04 DIAGNOSIS — G43909 Migraine, unspecified, not intractable, without status migrainosus: Secondary | ICD-10-CM | POA: Diagnosis present

## 2015-08-04 DIAGNOSIS — Z888 Allergy status to other drugs, medicaments and biological substances status: Secondary | ICD-10-CM

## 2015-08-04 DIAGNOSIS — K9189 Other postprocedural complications and disorders of digestive system: Secondary | ICD-10-CM | POA: Insufficient documentation

## 2015-08-04 DIAGNOSIS — Z885 Allergy status to narcotic agent status: Secondary | ICD-10-CM

## 2015-08-04 DIAGNOSIS — E871 Hypo-osmolality and hyponatremia: Secondary | ICD-10-CM | POA: Diagnosis present

## 2015-08-04 DIAGNOSIS — R188 Other ascites: Secondary | ICD-10-CM | POA: Diagnosis present

## 2015-08-04 DIAGNOSIS — K839 Disease of biliary tract, unspecified: Secondary | ICD-10-CM | POA: Insufficient documentation

## 2015-08-04 DIAGNOSIS — Z79891 Long term (current) use of opiate analgesic: Secondary | ICD-10-CM

## 2015-08-04 DIAGNOSIS — K838 Other specified diseases of biliary tract: Principal | ICD-10-CM | POA: Diagnosis present

## 2015-08-04 DIAGNOSIS — J189 Pneumonia, unspecified organism: Secondary | ICD-10-CM

## 2015-08-04 DIAGNOSIS — Z880 Allergy status to penicillin: Secondary | ICD-10-CM

## 2015-08-04 DIAGNOSIS — F17210 Nicotine dependence, cigarettes, uncomplicated: Secondary | ICD-10-CM | POA: Diagnosis present

## 2015-08-04 DIAGNOSIS — Z7982 Long term (current) use of aspirin: Secondary | ICD-10-CM

## 2015-08-04 HISTORY — DX: Malignant (primary) neoplasm, unspecified: C80.1

## 2015-08-04 LAB — CBC
HCT: 39.9 % (ref 35.0–47.0)
HEMOGLOBIN: 13.8 g/dL (ref 12.0–16.0)
MCH: 30.7 pg (ref 26.0–34.0)
MCHC: 34.6 g/dL (ref 32.0–36.0)
MCV: 88.7 fL (ref 80.0–100.0)
Platelets: 480 10*3/uL — ABNORMAL HIGH (ref 150–440)
RBC: 4.5 MIL/uL (ref 3.80–5.20)
RDW: 13.2 % (ref 11.5–14.5)
WBC: 20.5 10*3/uL — ABNORMAL HIGH (ref 3.6–11.0)

## 2015-08-04 LAB — COMPREHENSIVE METABOLIC PANEL
ALK PHOS: 141 U/L — AB (ref 38–126)
ALT: 29 U/L (ref 14–54)
ANION GAP: 11 (ref 5–15)
AST: 29 U/L (ref 15–41)
Albumin: 3.2 g/dL — ABNORMAL LOW (ref 3.5–5.0)
BILIRUBIN TOTAL: 1.7 mg/dL — AB (ref 0.3–1.2)
BUN: 17 mg/dL (ref 6–20)
CALCIUM: 9.2 mg/dL (ref 8.9–10.3)
CO2: 25 mmol/L (ref 22–32)
Chloride: 98 mmol/L — ABNORMAL LOW (ref 101–111)
Creatinine, Ser: 0.44 mg/dL (ref 0.44–1.00)
GFR calc Af Amer: >60 mL/min (ref >60)
GFR calc non Af Amer: >60 mL/min (ref >60)
GLUCOSE: 169 mg/dL — AB (ref 65–99)
Potassium: 3.6 mmol/L (ref 3.5–5.1)
Sodium: 134 mmol/L — ABNORMAL LOW (ref 135–145)
TOTAL PROTEIN: 7.5 g/dL (ref 6.5–8.1)

## 2015-08-04 LAB — LIPASE, BLOOD: LIPASE: 16 U/L (ref 11–51)

## 2015-08-04 MED ORDER — ONDANSETRON HCL 4 MG/2ML IJ SOLN
4.0000 mg | Freq: Once | INTRAMUSCULAR | Status: AC
  Administered 2015-08-04: 4 mg via INTRAVENOUS

## 2015-08-04 MED ORDER — SODIUM CHLORIDE 0.9 % IV BOLUS (SEPSIS)
1000.0000 mL | Freq: Once | INTRAVENOUS | Status: AC
  Administered 2015-08-04: 1000 mL via INTRAVENOUS

## 2015-08-04 MED ORDER — IOHEXOL 240 MG/ML SOLN
50.0000 mL | Freq: Once | INTRAMUSCULAR | Status: AC | PRN
  Administered 2015-08-04: 50 mL via ORAL

## 2015-08-04 MED ORDER — MORPHINE SULFATE (PF) 4 MG/ML IV SOLN
4.0000 mg | Freq: Once | INTRAVENOUS | Status: AC
  Administered 2015-08-04: 4 mg via INTRAVENOUS

## 2015-08-04 MED ORDER — ONDANSETRON HCL 4 MG/2ML IJ SOLN
INTRAMUSCULAR | Status: AC
  Administered 2015-08-04: 4 mg via INTRAVENOUS
  Filled 2015-08-04: qty 2

## 2015-08-04 MED ORDER — MORPHINE SULFATE (PF) 4 MG/ML IV SOLN
INTRAVENOUS | Status: AC
  Administered 2015-08-04: 4 mg via INTRAVENOUS
  Filled 2015-08-04: qty 1

## 2015-08-04 MED ORDER — IOHEXOL 240 MG/ML SOLN: 25.0000 mL | Freq: Once | INTRAMUSCULAR | Status: DC | PRN

## 2015-08-04 NOTE — ED Notes (Signed)
Quale,MD consulted. MD made aware of presenting complaints and triage assessment. MD with VORB for CBC, CMP, Lipase only at time. MD does not want any imaging done until patient is seen and evaluated by EDP. Orders to be entered and carried by this RN.

## 2015-08-04 NOTE — ED Notes (Signed)
MD to subwait area per request of this RN to see patient for immediate assessment and further intervention above and beyond what has already been done. MD to proceed with CT scan.

## 2015-08-04 NOTE — ED Notes (Signed)
CT contrast provided to patient in subwait area. Patient instructed to drink one bottle over one hour, and to start second bottle at 0030; verbalized understanding. Patient premedicated with Zofran 4mg  IVP. Patient to staff should nausea become severe to the point of where she feels like she is going to vomit or cannot tolerate any more contrast.

## 2015-08-04 NOTE — ED Notes (Signed)
Patient presents with c/o pain and pressure to entire abd s/p lap chole on Thursday: ?? Loflin, MD. Patient denies N/V/D/F.

## 2015-08-05 ENCOUNTER — Observation Stay: Payer: BLUE CROSS/BLUE SHIELD

## 2015-08-05 DIAGNOSIS — R109 Unspecified abdominal pain: Secondary | ICD-10-CM | POA: Diagnosis present

## 2015-08-05 LAB — BILIRUBIN, DIRECT: BILIRUBIN DIRECT: 0.5 mg/dL (ref 0.1–0.5)

## 2015-08-05 MED ORDER — MORPHINE SULFATE (PF) 4 MG/ML IV SOLN
4.0000 mg | INTRAVENOUS | Status: DC | PRN
  Administered 2015-08-05 (×3): 4 mg via INTRAVENOUS
  Administered 2015-08-05: 2 mg via INTRAVENOUS
  Filled 2015-08-05 (×4): qty 1

## 2015-08-05 MED ORDER — METOCLOPRAMIDE HCL 5 MG/ML IJ SOLN
10.0000 mg | Freq: Once | INTRAMUSCULAR | Status: AC
Start: 2015-08-05 — End: 2015-08-05
  Administered 2015-08-05: 10 mg via INTRAVENOUS

## 2015-08-05 MED ORDER — IOHEXOL 300 MG/ML  SOLN
100.0000 mL | Freq: Once | INTRAMUSCULAR | Status: AC | PRN
  Administered 2015-08-05: 100 mL via INTRAVENOUS

## 2015-08-05 MED ORDER — ZOLPIDEM TARTRATE 5 MG PO TABS
5.0000 mg | ORAL_TABLET | Freq: Once | ORAL | Status: AC
  Administered 2015-08-05: 5 mg via ORAL
  Filled 2015-08-05: qty 1

## 2015-08-05 MED ORDER — SODIUM CHLORIDE 0.9 % IJ SOLN
INTRAMUSCULAR | Status: AC
Start: 2015-08-05 — End: 2015-08-05
  Filled 2015-08-05: qty 10

## 2015-08-05 MED ORDER — DIPHENHYDRAMINE HCL 25 MG PO CAPS: 25.0000 mg | ORAL_CAPSULE | ORAL | Status: DC | PRN

## 2015-08-05 MED ORDER — TECHNETIUM TC 99M MEBROFENIN IV KIT
5.1980 | PACK | Freq: Once | INTRAVENOUS | Status: AC | PRN
  Administered 2015-08-05: 5.198 via INTRAVENOUS

## 2015-08-05 MED ORDER — HYDROMORPHONE HCL 1 MG/ML IJ SOLN
1.0000 mg | INTRAMUSCULAR | Status: AC
  Administered 2015-08-05: 1 mg via INTRAVENOUS

## 2015-08-05 MED ORDER — PROMETHAZINE HCL 25 MG/ML IJ SOLN
12.5000 mg | INTRAMUSCULAR | Status: DC | PRN
  Administered 2015-08-05: 12.5 mg via INTRAVENOUS
  Filled 2015-08-05: qty 1

## 2015-08-05 MED ORDER — MORPHINE SULFATE (PF) 2 MG/ML IV SOLN
2.0000 mg | INTRAVENOUS | Status: DC | PRN
  Administered 2015-08-05: 2 mg via INTRAVENOUS
  Filled 2015-08-05: qty 1

## 2015-08-05 MED ORDER — KETOROLAC TROMETHAMINE 30 MG/ML IJ SOLN
30.0000 mg | Freq: Once | INTRAMUSCULAR | Status: AC
  Administered 2015-08-05: 30 mg via INTRAVENOUS
  Filled 2015-08-05: qty 1

## 2015-08-05 MED ORDER — OXYCODONE-ACETAMINOPHEN 5-325 MG PO TABS
ORAL_TABLET | ORAL | Status: AC
  Filled 2015-08-05: qty 2

## 2015-08-05 MED ORDER — LEVOFLOXACIN IN D5W 500 MG/100ML IV SOLN
500.0000 mg | Freq: Every day | INTRAVENOUS | Status: DC
  Administered 2015-08-05 – 2015-08-07 (×3): 500 mg via INTRAVENOUS
  Filled 2015-08-05 (×4): qty 100

## 2015-08-05 MED ORDER — POTASSIUM CHLORIDE IN NACL 20-0.9 MEQ/L-% IV SOLN
INTRAVENOUS | Status: DC
  Administered 2015-08-05 – 2015-08-07 (×6): via INTRAVENOUS
  Filled 2015-08-05 (×10): qty 1000

## 2015-08-05 MED ORDER — SODIUM CHLORIDE 0.9 % IV SOLN
1.0000 g | INTRAVENOUS | Status: DC
  Administered 2015-08-05 – 2015-08-07 (×3): 1 g via INTRAVENOUS
  Filled 2015-08-05 (×4): qty 1

## 2015-08-05 MED ORDER — ACETAMINOPHEN 10 MG/ML IV SOLN
1000.0000 mg | Freq: Once | INTRAVENOUS | Status: AC
  Administered 2015-08-05: 1000 mg via INTRAVENOUS
  Filled 2015-08-05: qty 100

## 2015-08-05 MED ORDER — MORPHINE SULFATE (PF) 2 MG/ML IV SOLN
2.0000 mg | INTRAVENOUS | Status: DC | PRN
  Administered 2015-08-05 – 2015-08-06 (×3): 2 mg via INTRAVENOUS
  Filled 2015-08-05 (×3): qty 1

## 2015-08-05 MED ORDER — HYDROMORPHONE HCL 1 MG/ML IJ SOLN
INTRAMUSCULAR | Status: AC
  Administered 2015-08-05: 1 mg via INTRAVENOUS
  Filled 2015-08-05: qty 1

## 2015-08-05 MED ORDER — METOCLOPRAMIDE HCL 5 MG/ML IJ SOLN
INTRAMUSCULAR | Status: AC
  Administered 2015-08-05: 10 mg via INTRAVENOUS
  Filled 2015-08-05: qty 2

## 2015-08-05 MED ORDER — OXYCODONE-ACETAMINOPHEN 7.5-325 MG PO TABS
2.0000 | ORAL_TABLET | ORAL | Status: DC | PRN
  Administered 2015-08-05 – 2015-08-06 (×5): 2 via ORAL
  Administered 2015-08-06: 1 via ORAL
  Administered 2015-08-06: 2 via ORAL
  Administered 2015-08-06: 1 via ORAL
  Administered 2015-08-06 – 2015-08-07 (×3): 2 via ORAL
  Administered 2015-08-07 (×2): 1 via ORAL
  Filled 2015-08-05 (×2): qty 1
  Filled 2015-08-05: qty 2
  Filled 2015-08-05: qty 1
  Filled 2015-08-05 (×6): qty 2
  Filled 2015-08-05 (×2): qty 1
  Filled 2015-08-05 (×4): qty 2

## 2015-08-05 NOTE — ED Notes (Signed)
Patient transported to CT 

## 2015-08-05 NOTE — ED Notes (Signed)
MD at bedside. 

## 2015-08-05 NOTE — ED Notes (Signed)
Patient transported to X-ray 

## 2015-08-05 NOTE — ED Provider Notes (Addendum)
[facility] Emergency Department Provider Note  ____________________________________________  Time seen: 12:00 AM  I have reviewed the triage vital signs and the nursing notes.   HISTORY  Chief Complaint Post-op Problem     HPI Sydney Wilson is a 45 y.o. female recently diagnosed with cholecystitis status post cholecystectomy on 07/30/2015 presents with worsening abdominal pain and nausea. Patient current pain score is 10 out of 10 predominately in the right upper quadrant. She denies any alleviating or aggravating factors. Patient afebrile on presentation denies any fever at home. Patient does admit to constipation    Past Medical History  Diagnosis Date  . Migraines     Patient Active Problem List   Diagnosis Date Noted  . Acute cholecystitis     Past Surgical History  Procedure Laterality Date  . Carpel tunnel release    . Minor release dorsal compartment (dequervains) Right   . Cholecystectomy  07/30/2015  . Cholecystectomy N/A 07/30/2015    Procedure: LAPAROSCOPIC CHOLECYSTECTOMY;  Surgeon: Jules Husbands, MD;  Location: [facility] ORS;  Service: General;  Laterality: N/A;    Current Outpatient Rx  Name  Route  Sig  Dispense  Refill  . aspirin-acetaminophen-caffeine (EXCEDRIN MIGRAINE) 250-250-65 MG tablet   Oral   Take 1 tablet by mouth every 6 (six) hours as needed for headache.         . ondansetron (ZOFRAN ODT) 4 MG disintegrating tablet   Oral   Take 1 tablet (4 mg total) by mouth every 8 (eight) hours as needed for nausea or vomiting.   20 tablet   0   . oxyCODONE-acetaminophen (PERCOCET/ROXICET) 5-325 MG tablet   Oral   Take 1-2 tablets by mouth every 4 (four) hours as needed for moderate pain.   30 tablet   0     Allergies Prednisone; Vantin; Avelox; and Penicillins  No family history on file.  Social History Social History  Substance Use Topics  . Smoking status: Current Every Day Smoker  . Smokeless tobacco: None   . Alcohol Use: No    Review of Systems  Constitutional: Negative for fever. Eyes: Negative for visual changes. ENT: Negative for sore throat. Cardiovascular: Negative for chest pain. Respiratory: Negative for shortness of breath. Gastrointestinal: Positive for abdominal pain and nausea Genitourinary: Negative for dysuria. Musculoskeletal: Negative for back pain. Skin: Negative for rash. Neurological: Negative for headaches, focal weakness or numbness.   10-point ROS otherwise negative.  ____________________________________________   PHYSICAL EXAM:  VITAL SIGNS: ED Triage Vitals  Enc Vitals Group     BP 08/04/15 2237 139/86 mmHg     Pulse Rate 08/04/15 2237 96     Resp 08/04/15 2237 24     Temp 08/04/15 2237 98 F (36.7 C)     Temp Source 08/04/15 2237 Oral     SpO2 08/04/15 2237 96 %     Weight --      Height --      Head Cir --      Peak Flow --      Pain Score 08/04/15 2238 10     Pain Loc --      Pain Edu? --      Excl. in [place]? --      Constitutional: Alert and oriented. Well appearing and in no distress. Eyes: Conjunctivae are normal. PERRL. Normal extraocular movements. ENT   Head: Normocephalic and atraumatic.   Nose: No congestion/rhinnorhea.   Mouth/Throat: Mucous membranes are moist.   Neck: No  stridor. Hematological/Lymphatic/Immunilogical: No cervical lymphadenopathy. Cardiovascular: Normal rate, regular rhythm. Normal and symmetric distal pulses are present in all extremities. No murmurs, rubs, or gallops. Respiratory: Normal respiratory effort without tachypnea nor retractions. Breath sounds are clear and equal bilaterally. No wheezes/rales/rhonchi. Gastrointestinal: Soft and nontender. No distention. There is no CVA tenderness. Genitourinary: deferred Musculoskeletal: Nontender with normal range of motion in all extremities. No joint effusions.  No lower extremity tenderness nor edema. Neurologic:  Normal speech and language. No  gross focal neurologic deficits are appreciated. Speech is normal.  Skin:  Skin is warm, dry and intact. No rash noted. Psychiatric: Mood and affect are normal. Speech and behavior are normal. Patient exhibits appropriate insight and judgment.  ____________________________________________    LABS (pertinent positives/negatives)  Labs Reviewed  COMPREHENSIVE METABOLIC PANEL - Abnormal; Notable for the following:    Sodium 134 (*)    Chloride 98 (*)    Glucose, Bld 169 (*)    Albumin 3.2 (*)    Alkaline Phosphatase 141 (*)    Total Bilirubin 1.7 (*)    All other components within normal limits  CBC - Abnormal; Notable for the following:    WBC 20.5 (*)    Platelets 480 (*)    All other components within normal limits  LIPASE, BLOOD  BILIRUBIN, DIRECT  CBC  COMPREHENSIVE METABOLIC PANEL      RADIOLOGY   NM Hepatobiliary Liver Func (Final result) Result time: 08/05/15 09:00:37   Final result by Rad Results In Interface (08/05/15 09:00:37)   Narrative:   CLINICAL DATA: History of recent cholecystectomy with free fluid within the abdomen, suspicious for bile leak.  EXAM: NUCLEAR MEDICINE HEPATOBILIARY IMAGING  TECHNIQUE: Sequential images of the abdomen were obtained out to 60 minutes following intravenous administration of radiopharmaceutical.  RADIOPHARMACEUTICALS: 5.2 mCi Tc-36m Choletec IV  COMPARISON: CT examination from earlier in the same day  FINDINGS: There is adequate uptake throughout the liver immediately following injection. Visualization of the biliary tree is noted at 5 minutes. Additionally there is pooling in the gallbladder fossa at 5 minutes. The collection in the gallbladder fossa increases significantly over time 229 minutes. Additionally there is passage of tracer adjacent to the liver consistent with bile leak.  IMPRESSION: Changes consistent with a bile leak predominately involving the gallbladder fossa although tracer does pass  into other areas within the peritoneal cavity as well.   Electronically Signed By: Inez Catalina M.D. On: 08/05/2015 09:00          DG Chest 2 View (Final result) Result time: 08/05/15 03:18:44   Final result by Rad Results In Interface (08/05/15 03:18:44)   Narrative:   CLINICAL DATA: Pneumonia, abdominal pain after cholecystectomy 5 days ago.  EXAM: CHEST 2 VIEW  COMPARISON: CT chest Nov 18, 2009  FINDINGS: The cardiac silhouette appears mildly enlarged. Mediastinal silhouette is nonsuspicious. Small RIGHT pleural effusion with patchy consolidation RIGHT lung base. Trachea projects midline and there is no pneumothorax. Soft tissue planes and included osseous structures are normal.  IMPRESSION: Mild cardiomegaly.  Small RIGHT pleural effusion with RIGHT lung base suspected pneumonia. Followup PA and lateral chest X-ray is recommended in 3-4 weeks following trial of antibiotic therapy to ensure resolution and exclude underlying malignancy.   Electronically Signed By: Elon Alas M.D. On: 08/05/2015 03:18          CT Abdomen Pelvis W Contrast (Final result) Result time: 08/05/15 02:52:28   Final result by Rad Results In Interface (08/05/15 02:52:28)   Narrative:  CLINICAL DATA: Generalized abdominal pain with nausea post cholecystectomy 07/30/2015.  EXAM: CT ABDOMEN AND PELVIS WITH CONTRAST  TECHNIQUE: Multidetector CT imaging of the abdomen and pelvis was performed using the standard protocol following bolus administration of intravenous contrast.  CONTRAST: 165mL OMNIPAQUE IOHEXOL 300 MG/ML SOLN  COMPARISON: None available  FINDINGS: Lower chest and abdominal wall: Streaky right lower lobe opacity with volume loss consistent with atelectasis. There could be superimposed infection.  Hepatobiliary: No focal liver abnormality.Recent cholecystectomy with expected reticulation in the gallbladder fossa. No bile  duct enlargement. There is a moderate amount of ascites with peritoneal thickening best seen in the hepatorenal fossa and pelvis. No rounded collection typical of abscess. No calcified choledocholithiasis.  Pancreas: Unremarkable.  Spleen: Unremarkable.  Adrenals/Urinary Tract: Negative adrenals. No hydronephrosis or stone. Unremarkable bladder.  Reproductive:No pathologic findings.  Stomach/Bowel: Diffuse distension of small and large bowel without transition point. No evidence of bowel necrosis or perforation.  Vascular/Lymphatic: No acute vascular abnormality. No mass or adenopathy.  Peritoneal: Ascites as above.  Musculoskeletal: No acute abnormalities.  IMPRESSION: 1. Recent cholecystectomy with prominent ascites and peritoneal thickening. Recommend HIDA to evaluate for bile leak. 2. Mild ileus.   Electronically Signed By: Monte Fantasia M.D. On: 08/05/2015 02:52       INITIAL IMPRESSION / ASSESSMENT AND PLAN / ED COURSE  Pertinent labs & imaging results that were available during my care of the patient were reviewed by me and considered in my medical decision making (see chart for details).  History of physical exam concerning for possible bile leak status post cholecystectomy as such CT scan of the abdomen performed. Patient discussed with Dr. Tollie Pizza surgeon on-call for hospital admission for further evaluation and management including possible HIDA scan  ____________________________________________   FINAL CLINICAL IMPRESSION(S) / ED DIAGNOSES  Final diagnoses:  Bile leak, postoperative  Pneumonia      Gregor Hams, MD 08/06/15 [id number]  Gregor Hams, MD 08/06/15 [id number]

## 2015-08-05 NOTE — Progress Notes (Signed)
45 yr old female POD#7 from Lap chole for acute cholecystitis now with increasing abdominal pain.  HIDA revealed likely duct of luschka leak.  Patient doing well, tolerating clear liquids without issue.  Pain controlled well with percocet.   Filed Vitals:   08/05/15 1138 08/05/15 1614  BP: 98/60   Pulse: 104   Temp: 98.2 F (36.8 C) 98.5 F (36.9 C)  Resp: 17    I/O last 3 completed shifts: In: 250 [IV Piggyback:250] Out: 501 [Urine:500; Stool:1] Total I/O In: 240 [P.O.:240] Out: 275 [Urine:275]  PE:  Gen: NAD Abd: soft, tender in RUQ and right flank, incisions c/d/i Ext: 2+ pulses, no edema  CBC Latest Ref Rng 08/04/2015 07/30/2015  WBC 3.6 - 11.0 K/uL 20.5(H) 13.9(H)  Hemoglobin 12.0 - 16.0 g/dL 13.8 14.5  Hematocrit 35.0 - 47.0 % 39.9 42.7  Platelets 150 - 440 K/uL 480(H) 321    CMP Latest Ref Rng 08/04/2015 07/30/2015  Glucose 65 - 99 mg/dL 169(H) 134(H)  BUN 6 - 20 mg/dL 17 20  Creatinine 0.44 - 1.00 mg/dL 0.44 0.53  Sodium 135 - 145 mmol/L 134(L) 136  Potassium 3.5 - 5.1 mmol/L 3.6 3.5  Chloride 101 - 111 mmol/L 98(L) 105  CO2 22 - 32 mmol/L 25 20(L)  Calcium 8.9 - 10.3 mg/dL 9.2 9.0  Total Protein 6.5 - 8.1 g/dL 7.5 7.6  Total Bilirubin 0.3 - 1.2 mg/dL 1.7(H) 1.1  Alkaline Phos 38 - 126 U/L 141(H) 71  AST 15 - 41 U/L 29 22  ALT 14 - 54 U/L 29 17    A/P:  POD#7 from Lap chole with bile leak from Gallbladder fossa.  Discussed with Dr. Allen Norris of GI, who has put her on for ERCP tomorrow at 11AM.  Will make her NPO at midnight.  Continue IV antibiotics

## 2015-08-05 NOTE — ED Notes (Addendum)
R upper and mid abdominal pain since gallbladder removal 07/30/15. Pt discharged 07/31/15 and states pain worsening since then. Tenderness. 4 surgical incisions present, covered with glue, scant bruising around sites, no redness visible.  Pt drinking CT contrast, approx 1/4 left of first bottle.

## 2015-08-05 NOTE — ED Notes (Addendum)
Dr Fleet Contras at pt bedside.

## 2015-08-05 NOTE — H&P (Addendum)
Sydney Wilson is an 45 y.o. female.   Chief Complaint: Abdominal pain  HPI: 45 year old woman who underwent cholecystectomy on 07/30/2015 for severe right upper quadrant pain. Pathology showed evidence of chronic cholecystitis and cholelithiasis. The patient reports that since discharge on January 27 she's had increasing pain initially in the right upper quadrant now extending across rest of the abdomen. There's been nausea but no vomiting. No reported diarrhea. Worsening pain prompted her presentation to the emergency room tonight. She was accompanied by 2 of her 4 sons.  Past Medical History  Diagnosis Date  . Migraines     Past Surgical History  Procedure Laterality Date  . Carpel tunnel release    . Minor release dorsal compartment (dequervains) Right   . Cholecystectomy  07/30/2015  . Cholecystectomy N/A 07/30/2015    Procedure: LAPAROSCOPIC CHOLECYSTECTOMY;  Surgeon: Jules Husbands, MD;  Location: [facility] ORS;  Service: General;  Laterality: N/A;    No family history on file. Social History:  reports that she has been smoking.  She does not have any smokeless tobacco history on file. She reports that she does not drink alcohol. Her drug history is not on file.  Allergies:  Allergies  Allergen Reactions  . Prednisone Shortness Of Breath  . Vantin [Cefpodoxime]   . Avelox [Moxifloxacin Hcl In Nacl] Rash  . Penicillins Rash     (Not in a hospital admission)  Results for orders placed or performed during the hospital encounter of 08/04/15 (from the past 48 hour(s))  Lipase, blood     Status: None   Collection Time: 08/04/15 10:47 PM  Result Value Ref Range   Lipase 16 11 - 51 U/L  Comprehensive metabolic panel     Status: Abnormal   Collection Time: 08/04/15 10:47 PM  Result Value Ref Range   Sodium 134 (L) 135 - 145 mmol/L   Potassium 3.6 3.5 - 5.1 mmol/L   Chloride 98 (L) 101 - 111 mmol/L   CO2 25 22 - 32 mmol/L   Glucose, Bld 169 (H) 65 - 99 mg/dL   BUN 17 6 - 20 mg/dL    Creatinine, Ser 0.44 0.44 - 1.00 mg/dL   Calcium 9.2 8.9 - 10.3 mg/dL   Total Protein 7.5 6.5 - 8.1 g/dL   Albumin 3.2 (L) 3.5 - 5.0 g/dL   AST 29 15 - 41 U/L   ALT 29 14 - 54 U/L   Alkaline Phosphatase 141 (H) 38 - 126 U/L   Total Bilirubin 1.7 (H) 0.3 - 1.2 mg/dL   GFR calc non Af Amer >60 >60 mL/min   GFR calc Af Amer >60 >60 mL/min    Comment: (NOTE) The eGFR has been calculated using the CKD EPI equation. This calculation has not been validated in all clinical situations. eGFR's persistently <60 mL/min signify possible Chronic Kidney Disease.    Anion gap 11 5 - 15  CBC     Status: Abnormal   Collection Time: 08/04/15 10:47 PM  Result Value Ref Range   WBC 20.5 (H) 3.6 - 11.0 K/uL   RBC 4.50 3.80 - 5.20 MIL/uL   Hemoglobin 13.8 12.0 - 16.0 g/dL   HCT 39.9 35.0 - 47.0 %   MCV 88.7 80.0 - 100.0 fL   MCH 30.7 26.0 - 34.0 pg   MCHC 34.6 32.0 - 36.0 g/dL   RDW 13.2 11.5 - 14.5 %   Platelets 480 (H) 150 - 440 K/uL   No results found.  Review of Systems  Constitutional: Negative for fever and chills.  HENT: Negative.   Eyes: Negative.   Respiratory: Negative.   Cardiovascular: Negative.   Gastrointestinal: Positive for nausea and abdominal pain. Negative for diarrhea.  Genitourinary: Negative.   Skin: Negative.   Neurological: Negative.   Endo/Heme/Allergies: Negative.   Psychiatric/Behavioral: Negative.     Blood pressure 135/87, pulse 87, temperature 98 F (36.7 C), temperature source Oral, resp. rate 24, last menstrual period 07/27/2015, SpO2 94 %. Physical Exam  Constitutional: She is oriented to person, place, and time. She appears well-developed and well-nourished. She appears distressed.  HENT:  Head: Normocephalic.  Eyes: Pupils are equal, round, and reactive to light.  Neck: Neck supple. No tracheal deviation present. No thyromegaly present.  Cardiovascular: Normal rate and regular rhythm.   Respiratory: She has decreased breath sounds in the right  lower field and the left lower field. She has no wheezes. She has no rhonchi. She has no rales.  GI: She exhibits distension. She exhibits no mass. There is tenderness. There is no rebound and no guarding.    Musculoskeletal: She exhibits no edema.  Neurological: She is alert and oriented to person, place, and time.  Skin: Skin is warm and dry.  Psychiatric: She has a normal mood and affect. Her behavior is normal. Judgment and thought content normal.     Assessment/Plan Laboratory review showed an elevation of serum bilirubin 1.7. Transaminases remain normal. Alkaline phosphatase is moderately elevated at 141. Blood sugar elevated 169. Mild hyponatremia. Lipase is normal. CBC shows a white blood cell count 20,500. Hemoglobin 13.8, mildly depressed from the value of 14.5 prior to surgery. Platelet count is elevated at 480,000.  Source for her abdominal pain is unclear but do to her severe distress she'll be admitted. CT scan is pending per request of her ED M.D. Further diagnostic studies and treatment plans will be determined after the CT scan is reviewed. Clinical exam does not suggest intestinal perforation but she will be placed on IV antibiotics pending CT results.  Robert Bellow 08/05/2015, 1:44 AM   CT suggests prominent RML infiltrate. Free fluid especially in the RUQ around the liver, some tracking to the pelvis. Viscera appear normal. Will arrange for HIDA scan in AM to assess for biliary leak with modest elevation of alk phos and bilirubin. Will add Levaquin pending further ID.

## 2015-08-05 NOTE — ED Notes (Signed)
Pt drinking last bottle of CT contrast.

## 2015-08-06 ENCOUNTER — Inpatient Hospital Stay: Payer: BLUE CROSS/BLUE SHIELD | Admitting: Anesthesiology

## 2015-08-06 ENCOUNTER — Encounter
Admission: EM | Disposition: A | Discharge status: Home / Self Care | Payer: Self-pay | Source: Home / Self Care | Attending: Surgery

## 2015-08-06 ENCOUNTER — Encounter: Payer: Self-pay | Admitting: *Deleted

## 2015-08-06 DIAGNOSIS — R188 Other ascites: Secondary | ICD-10-CM | POA: Diagnosis present

## 2015-08-06 DIAGNOSIS — K567 Ileus, unspecified: Secondary | ICD-10-CM | POA: Diagnosis present

## 2015-08-06 DIAGNOSIS — K929 Disease of digestive system, unspecified: Secondary | ICD-10-CM | POA: Diagnosis not present

## 2015-08-06 DIAGNOSIS — K259 Gastric ulcer, unspecified as acute or chronic, without hemorrhage or perforation: Secondary | ICD-10-CM | POA: Diagnosis present

## 2015-08-06 DIAGNOSIS — K839 Disease of biliary tract, unspecified: Secondary | ICD-10-CM | POA: Diagnosis not present

## 2015-08-06 DIAGNOSIS — K9189 Other postprocedural complications and disorders of digestive system: Secondary | ICD-10-CM

## 2015-08-06 DIAGNOSIS — K838 Other specified diseases of biliary tract: Secondary | ICD-10-CM | POA: Diagnosis present

## 2015-08-06 DIAGNOSIS — E871 Hypo-osmolality and hyponatremia: Secondary | ICD-10-CM | POA: Diagnosis present

## 2015-08-06 DIAGNOSIS — Z79899 Other long term (current) drug therapy: Secondary | ICD-10-CM | POA: Diagnosis not present

## 2015-08-06 DIAGNOSIS — Z880 Allergy status to penicillin: Secondary | ICD-10-CM | POA: Diagnosis not present

## 2015-08-06 DIAGNOSIS — Z9889 Other specified postprocedural states: Secondary | ICD-10-CM | POA: Diagnosis not present

## 2015-08-06 DIAGNOSIS — Z888 Allergy status to other drugs, medicaments and biological substances status: Secondary | ICD-10-CM | POA: Diagnosis not present

## 2015-08-06 DIAGNOSIS — Z7982 Long term (current) use of aspirin: Secondary | ICD-10-CM | POA: Diagnosis not present

## 2015-08-06 DIAGNOSIS — Z79891 Long term (current) use of opiate analgesic: Secondary | ICD-10-CM | POA: Diagnosis not present

## 2015-08-06 DIAGNOSIS — G43909 Migraine, unspecified, not intractable, without status migrainosus: Secondary | ICD-10-CM | POA: Diagnosis present

## 2015-08-06 DIAGNOSIS — Z885 Allergy status to narcotic agent status: Secondary | ICD-10-CM | POA: Diagnosis not present

## 2015-08-06 DIAGNOSIS — F17210 Nicotine dependence, cigarettes, uncomplicated: Secondary | ICD-10-CM | POA: Diagnosis present

## 2015-08-06 DIAGNOSIS — R109 Unspecified abdominal pain: Secondary | ICD-10-CM | POA: Diagnosis present

## 2015-08-06 HISTORY — PX: ERCP: SHX5425

## 2015-08-06 LAB — COMPREHENSIVE METABOLIC PANEL
ALBUMIN: 2.7 g/dL — AB (ref 3.5–5.0)
ALT: 19 U/L (ref 14–54)
AST: 18 U/L (ref 15–41)
Alkaline Phosphatase: 125 U/L (ref 38–126)
Anion gap: 4 — ABNORMAL LOW (ref 5–15)
BILIRUBIN TOTAL: 1.3 mg/dL — AB (ref 0.3–1.2)
BUN: 13 mg/dL (ref 6–20)
CHLORIDE: 104 mmol/L (ref 101–111)
CO2: 25 mmol/L (ref 22–32)
Calcium: 8.1 mg/dL — ABNORMAL LOW (ref 8.9–10.3)
Creatinine, Ser: 0.33 mg/dL — ABNORMAL LOW (ref 0.44–1.00)
GFR calc Af Amer: >60 mL/min (ref >60)
GFR calc non Af Amer: >60 mL/min (ref >60)
GLUCOSE: 95 mg/dL (ref 65–99)
POTASSIUM: 4 mmol/L (ref 3.5–5.1)
Sodium: 133 mmol/L — ABNORMAL LOW (ref 135–145)
Total Protein: 6.3 g/dL — ABNORMAL LOW (ref 6.5–8.1)

## 2015-08-06 LAB — CBC
HCT: 33 % — ABNORMAL LOW (ref 35.0–47.0)
HEMOGLOBIN: 11.3 g/dL — AB (ref 12.0–16.0)
MCH: 30.8 pg (ref 26.0–34.0)
MCHC: 34.1 g/dL (ref 32.0–36.0)
MCV: 90.2 fL (ref 80.0–100.0)
Platelets: 387 10*3/uL (ref 150–440)
RBC: 3.66 MIL/uL — AB (ref 3.80–5.20)
RDW: 13 % (ref 11.5–14.5)
WBC: 17.4 10*3/uL — ABNORMAL HIGH (ref 3.6–11.0)

## 2015-08-06 SURGERY — ERCP, WITH INTERVENTION IF INDICATED
Anesthesia: General

## 2015-08-06 SURGERY — ENDOSCOPIC RETROGRADE CHOLANGIOPANCREATOGRAPHY (ERCP) WITH PROPOFOL
Anesthesia: Monitor Anesthesia Care

## 2015-08-06 MED ORDER — INDOMETHACIN 50 MG RE SUPP
100.0000 mg | Freq: Once | RECTAL | Status: AC
  Administered 2015-08-06: 100 mg via RECTAL
  Filled 2015-08-06 (×2): qty 2

## 2015-08-06 MED ORDER — SODIUM CHLORIDE 0.9 % IV SOLN
Freq: Once | INTRAVENOUS | Status: AC
  Administered 2015-08-06: 11:00:00 via INTRAVENOUS

## 2015-08-06 MED ORDER — HYDROMORPHONE HCL 1 MG/ML IJ SOLN
0.5000 mg | INTRAMUSCULAR | Status: DC | PRN
  Administered 2015-08-06 (×2): 0.5 mg via INTRAVENOUS
  Filled 2015-08-06 (×2): qty 1

## 2015-08-06 MED ORDER — LIDOCAINE HCL (CARDIAC) 20 MG/ML IV SOLN
INTRAVENOUS | Status: DC | PRN
  Administered 2015-08-06: 60 mg via INTRAVENOUS

## 2015-08-06 MED ORDER — FENTANYL CITRATE (PF) 100 MCG/2ML IJ SOLN
INTRAMUSCULAR | Status: DC | PRN
Start: 2015-08-06 — End: 2015-08-06
  Administered 2015-08-06: 50 ug via INTRAVENOUS

## 2015-08-06 MED ORDER — SODIUM CHLORIDE 0.9 % IV SOLN
INTRAVENOUS | Status: DC | PRN
  Administered 2015-08-06: 11:00:00 via INTRAVENOUS

## 2015-08-06 MED ORDER — PROPOFOL 500 MG/50ML IV EMUL
INTRAVENOUS | Status: DC | PRN
  Administered 2015-08-06: 160 ug/kg/min via INTRAVENOUS

## 2015-08-06 MED ORDER — MIDAZOLAM HCL 2 MG/2ML IJ SOLN
INTRAMUSCULAR | Status: DC | PRN
  Administered 2015-08-06 (×2): 1 mg via INTRAVENOUS

## 2015-08-06 NOTE — Anesthesia Postprocedure Evaluation (Signed)
Anesthesia Post Note  Patient: KAHMIYAH FRANCE  Procedure(s) Performed: Procedure(s) (LRB): ENDOSCOPIC RETROGRADE CHOLANGIOPANCREATOGRAPHY (ERCP) (N/A)  Patient location during evaluation: PACU Anesthesia Type: MAC Level of consciousness: awake Pain management: satisfactory to patient Vital Signs Assessment: post-procedure vital signs reviewed and stable Respiratory status: spontaneous breathing Cardiovascular status: stable Anesthetic complications: no    Last Vitals:  Filed Vitals:   08/06/15 1057 08/06/15 1158  BP: 124/66 135/72  Pulse: 106 105  Temp: 36.4 C 36.7 C  Resp:  12    Last Pain:  Filed Vitals:   08/06/15 1200  PainSc: Asleep                 VAN STAVEREN,Mahamadou Weltz

## 2015-08-06 NOTE — Anesthesia Preprocedure Evaluation (Signed)
Anesthesia Evaluation  Patient identified by MRN, date of birth, ID band Patient awake    Reviewed: Allergy & Precautions, NPO status , Patient's Chart, lab work & pertinent test results  History of Anesthesia Complications (+) MALIGNANT HYPERTHERMIA  Airway Mallampati: II       Dental  (+) Partial Upper   Pulmonary Current Smoker,    breath sounds clear to auscultation       Cardiovascular Exercise Tolerance: Good  Rhythm:Regular     Neuro/Psych Anxiety    GI/Hepatic negative GI ROS, Neg liver ROS,   Endo/Other  negative endocrine ROS  Renal/GU negative Renal ROS     Musculoskeletal   Abdominal Normal abdominal exam  (+)   Peds  Hematology negative hematology ROS (+)   Anesthesia Other Findings   Reproductive/Obstetrics                             Anesthesia Physical Anesthesia Plan  ASA: II  Anesthesia Plan: MAC   Post-op Pain Management:    Induction: Intravenous  Airway Management Planned: Natural Airway and Nasal Cannula  Additional Equipment:   Intra-op Plan:   Post-operative Plan:   Informed Consent: I have reviewed the patients History and Physical, chart, labs and discussed the procedure including the risks, benefits and alternatives for the proposed anesthesia with the patient or authorized representative who has indicated his/her understanding and acceptance.     Plan Discussed with: CRNA  Anesthesia Plan Comments:         Anesthesia Quick Evaluation

## 2015-08-06 NOTE — Progress Notes (Signed)
CC:  Subjective: Status post laparoscopic cholecystectomy developed bile leak from duct of Luschka status post ERCP, sphincterotomy and a stent placement today Significant improvement of abdominal pain since ERCP She actually has tolerated some clear liquid diet. No nausea vomiting  Objective: Vital signs in last 24 hours: Temp:  [97.6 F (36.4 C)-98.6 F (37 C)] 98.5 F (36.9 C) (02/02 1554) Pulse Rate:  [97-113] 110 (02/02 1554) Resp:  [10-20] 18 (02/02 1554) BP: (109-138)/(58-80) 110/70 mmHg (02/02 1554) SpO2:  [90 %-99 %] 94 % (02/02 1554) Last BM Date: 08/04/15  Intake/Output from previous day: 02/01 0701 - 02/02 0700 In: 240 [P.O.:240] Out: 825 [Urine:825] Intake/Output this shift:    Physical exam: Acute distress awake alert and nontoxic Chest: Clear to auscultation bilaterally. S1 and S2  Abdomen: Incision is clean dry and intact no evidence of infection. Abdomen soft nontender no peritonitis. Extremities: No edema well perfused   Lab Results: CBC   Recent Labs  08/04/15 2247 08/06/15 0657  WBC 20.5* 17.4*  HGB 13.8 11.3*  HCT 39.9 33.0*  PLT 480* 387   BMET  Recent Labs  08/04/15 2247 08/06/15 0657  NA 134* 133*  K 3.6 4.0  CL 98* 104  CO2 25 25  GLUCOSE 169* 95  BUN 17 13  CREATININE 0.44 0.33*  CALCIUM 9.2 8.1*   PT/INR No results for input(s): LABPROT, INR in the last 72 hours. ABG No results for input(s): PHART, HCO3 in the last 72 hours.  Invalid input(s): PCO2, PO2  Studies/Results: Dg Chest 2 View  08/05/2015  CLINICAL DATA:  Pneumonia, abdominal pain after cholecystectomy 5 days ago. EXAM: CHEST  2 VIEW COMPARISON:  CT chest Nov 18, 2009 FINDINGS: The cardiac silhouette appears mildly enlarged. Mediastinal silhouette is nonsuspicious. Small RIGHT pleural effusion with patchy consolidation RIGHT lung base. Trachea projects midline and there is no pneumothorax. Soft tissue planes and included osseous structures are normal. IMPRESSION:  Mild cardiomegaly. Small RIGHT pleural effusion with RIGHT lung base suspected pneumonia. Followup PA and lateral chest X-ray is recommended in 3-4 weeks following trial of antibiotic therapy to ensure resolution and exclude underlying malignancy. Electronically Signed   By: Elon Alas M.D.   On: 08/05/2015 03:18   Nm Hepatobiliary Liver Func  08/05/2015  CLINICAL DATA:  History of recent cholecystectomy with free fluid within the abdomen, suspicious for bile leak. EXAM: NUCLEAR MEDICINE HEPATOBILIARY IMAGING TECHNIQUE: Sequential images of the abdomen were obtained out to 60 minutes following intravenous administration of radiopharmaceutical. RADIOPHARMACEUTICALS:  5.2 mCi Tc-80m  Choletec IV COMPARISON:  CT examination from earlier in the same day FINDINGS: There is adequate uptake throughout the liver immediately following injection. Visualization of the biliary tree is noted at 5 minutes. Additionally there is pooling in the gallbladder fossa at 5 minutes. The collection in the gallbladder fossa increases significantly over time 229 minutes. Additionally there is passage of tracer adjacent to the liver consistent with bile leak. IMPRESSION: Changes consistent with a bile leak predominately involving the gallbladder fossa although tracer does pass into other areas within the peritoneal cavity as well. Electronically Signed   By: Inez Catalina M.D.   On: 08/05/2015 09:00   Ct Abdomen Pelvis W Contrast  08/05/2015  CLINICAL DATA:  Generalized abdominal pain with nausea post cholecystectomy 07/30/2015. EXAM: CT ABDOMEN AND PELVIS WITH CONTRAST TECHNIQUE: Multidetector CT imaging of the abdomen and pelvis was performed using the standard protocol following bolus administration of intravenous contrast. CONTRAST:  134mL OMNIPAQUE IOHEXOL 300 MG/ML  SOLN COMPARISON:  None available FINDINGS: Lower chest and abdominal wall: Streaky right lower lobe opacity with volume loss consistent with atelectasis. There  could be superimposed infection. Hepatobiliary: No focal liver abnormality.Recent cholecystectomy with expected reticulation in the gallbladder fossa. No bile duct enlargement. There is a moderate amount of ascites with peritoneal thickening best seen in the hepatorenal fossa and pelvis. No rounded collection typical of abscess. No calcified choledocholithiasis. Pancreas: Unremarkable. Spleen: Unremarkable. Adrenals/Urinary Tract: Negative adrenals. No hydronephrosis or stone. Unremarkable bladder. Reproductive:No pathologic findings. Stomach/Bowel: Diffuse distension of small and large bowel without transition point. No evidence of bowel necrosis or perforation. Vascular/Lymphatic: No acute vascular abnormality. No mass or adenopathy. Peritoneal: Ascites as above. Musculoskeletal: No acute abnormalities. IMPRESSION: 1. Recent cholecystectomy with prominent ascites and peritoneal thickening. Recommend HIDA to evaluate for bile leak. 2. Mild ileus. Electronically Signed   By: Monte Fantasia M.D.   On: 08/05/2015 02:52    Anti-infectives: Anti-infectives    Start     Dose/Rate Route Frequency Ordered Stop   08/05/15 0330  ertapenem (INVANZ) 1 g in sodium chloride 0.9 % 50 mL IVPB     1 g 100 mL/hr over 30 Minutes Intravenous Every 24 hours 08/05/15 0316     08/05/15 0330  levofloxacin (LEVAQUIN) IVPB 500 mg     500 mg 100 mL/hr over 60 Minutes Intravenous Daily 08/05/15 0236        Assessment/Plan: Doing very well after ERCP for now we'll continue antibiotics and follow her clinically likely advance her diet tomorrow and DC in the next 24-48 hours. No need for any further surgical intervention at this time. Appreciate Dr. Allen Norris  assistance   Caroleen Hamman, MD, [facility]  08/06/2015

## 2015-08-06 NOTE — Op Note (Signed)
[facility] Patient Name: Sydney Wilson Procedure Date: 08/06/2015 10:48 AM MRN: [id number] Account #: [id number] Date of Birth: Apr 21, 1971 Admit Type: Inpatient Age: 45 Room: [facility] ENDO ROOM 3 Gender: Female Note Status: Finalized Procedure:         ERCP Indications:       Bile leak Providers:         Lucilla Lame, MD Medicines:         Propofol per Anesthesia Complications:     No immediate complications. Procedure:         Pre-Anesthesia Assessment:                    - Prior to the procedure, a History and Physical was                     performed, and patient medications and allergies were                     reviewed. The patient's tolerance of previous anesthesia                     was also reviewed. The risks and benefits of the procedure                     and the sedation options and risks were discussed with the                     patient. All questions were answered, and informed consent                     was obtained. Prior Anticoagulants: The patient has taken                     no previous anticoagulant or antiplatelet agents. ASA                     Grade Assessment: II - A patient with mild systemic                     disease. After reviewing the risks and benefits, the                     patient was deemed in satisfactory condition to undergo                     the procedure.                    After obtaining informed consent, the scope was passed                     under direct vision. Throughout the procedure, the                     patient's blood pressure, pulse, and oxygen saturations                     were monitored continuously. The ERCP was introduced                     through the mouth, and used to inject contrast into and                     used to inject contrast into the bile duct. The  ERCP was                     accomplished without difficulty. The patient tolerated the                     procedure  well. Findings:      A scout film of the abdomen was obtained. Surgical clips were seen in       the area of the cystic duct. The upper GI tract was traversed under       direct vision without detailed examination. Few non-bleeding cratered       gastric ulcers with no stigmata of bleeding were found in the gastric       antrum. The esophagus was successfully intubated under direct vision.       The scope was advanced to a normal major papilla in the descending       duodenum without detailed examination of the pharynx, larynx and       associated structures, and upper GI tract. The upper GI tract was       grossly normal. The bile duct was deeply cannulated. Contrast was       injected. I personally interpreted the bile duct images. There was brisk       flow of contrast through the ducts. Image quality was excellent.       Contrast extended to the entire biliary tree. Extravasation of contrast       originating from the right upper quadrant of the abdomen was observed. A       wire was passed into the biliary tree. Biliary sphincterotomy was made       with a sphincterotome using ERBE electrocautery. There was no       post-sphincterotomy bleeding. One 10 Fr by 5 cm plastic stent with a       single external flap and a single internal flap was placed into the       common bile duct. Bile flowed through the stent. The stent was in good       position. Impression:        - Gastric ulcers with clean base.                    - A bile leak was found.                    - A sphincterotomy was performed.                    - One plastic stent was placed into the common bile duct. Recommendation:    - Watch for pancreatitis, bleeding, perforation, and                     cholangitis.                    - Repeat ERCP in 3 months to remove stent. Procedure Code(s): --- Professional ---                    [phone], Endoscopic retrograde cholangiopancreatography                     (ERCP); with  placement of endoscopic stent into biliary or                     pancreatic duct, including pre- and  post-dilation and                     guide wire passage, when performed, including                     sphincterotomy, when performed, each stent                    [phone], Endoscopic catheterization of the biliary ductal                     system, radiological supervision and interpretation Diagnosis Code(s): --- Professional ---                    K83.9, Disease of biliary tract, unspecified                    K83.8, Other specified diseases of biliary tract CPT copyright 2014 American Medical Association. All rights reserved. The codes documented in this report are preliminary and upon coder review may  be revised to meet current compliance requirements. Lucilla Lame, MD 08/06/2015 11:47:59 AM This report has been signed electronically. Number of Addenda: 0 Note Initiated On: 08/06/2015 10:48 AM      [facility]

## 2015-08-06 NOTE — Transfer of Care (Signed)
Immediate Anesthesia Transfer of Care Note  Patient: Sydney Wilson  Procedure(s) Performed: Procedure(s): ENDOSCOPIC RETROGRADE CHOLANGIOPANCREATOGRAPHY (ERCP) (N/A)  Patient Location: PACU  Anesthesia Type:General  Level of Consciousness: awake and patient cooperative  Airway & Oxygen Therapy: Patient Spontanous Breathing and Patient connected to nasal cannula oxygen  Post-op Assessment: Report given to RN  Post vital signs: Reviewed and stable  Last Vitals:  Filed Vitals:   08/06/15 1057 08/06/15 1158  BP: 124/66 135/72  Pulse: 106 105  Temp: 36.4 C 36.7 C  Resp:  12    Complications: No apparent anesthesia complications

## 2015-08-07 ENCOUNTER — Encounter: Payer: Self-pay | Admitting: Gastroenterology

## 2015-08-07 LAB — COMPREHENSIVE METABOLIC PANEL
ALBUMIN: 2.5 g/dL — AB (ref 3.5–5.0)
ALT: 16 U/L (ref 14–54)
AST: 18 U/L (ref 15–41)
Alkaline Phosphatase: 123 U/L (ref 38–126)
Anion gap: 7 (ref 5–15)
BUN: 11 mg/dL (ref 6–20)
CHLORIDE: 103 mmol/L (ref 101–111)
CO2: 25 mmol/L (ref 22–32)
Calcium: 8.3 mg/dL — ABNORMAL LOW (ref 8.9–10.3)
Creatinine, Ser: <0.3 mg/dL — ABNORMAL LOW (ref 0.44–1.00)
Glucose, Bld: 72 mg/dL (ref 65–99)
POTASSIUM: 3.7 mmol/L (ref 3.5–5.1)
SODIUM: 135 mmol/L (ref 135–145)
Total Bilirubin: 1.2 mg/dL (ref 0.3–1.2)
Total Protein: 6 g/dL — ABNORMAL LOW (ref 6.5–8.1)

## 2015-08-07 LAB — CBC
HCT: 30.2 % — ABNORMAL LOW (ref 35.0–47.0)
Hemoglobin: 10.3 g/dL — ABNORMAL LOW (ref 12.0–16.0)
MCH: 30.9 pg (ref 26.0–34.0)
MCHC: 34.3 g/dL (ref 32.0–36.0)
MCV: 90.3 fL (ref 80.0–100.0)
PLATELETS: 364 10*3/uL (ref 150–440)
RBC: 3.34 MIL/uL — AB (ref 3.80–5.20)
RDW: 13.2 % (ref 11.5–14.5)
WBC: 10.8 10*3/uL (ref 3.6–11.0)

## 2015-08-07 MED ORDER — METRONIDAZOLE 500 MG PO TABS: 500.0000 mg | ORAL_TABLET | Freq: Three times a day (TID) | ORAL | Status: DC

## 2015-08-07 MED ORDER — LEVOFLOXACIN 750 MG PO TABS: 750.0000 mg | ORAL_TABLET | Freq: Every day | ORAL | Status: DC

## 2015-08-07 MED ORDER — PANTOPRAZOLE SODIUM 40 MG PO TBEC: 40.0000 mg | DELAYED_RELEASE_TABLET | Freq: Two times a day (BID) | ORAL | Status: DC

## 2015-08-07 MED ORDER — PANTOPRAZOLE SODIUM 40 MG PO TBEC
40.0000 mg | DELAYED_RELEASE_TABLET | Freq: Two times a day (BID) | ORAL | Status: DC
  Administered 2015-08-07: 40 mg via ORAL
  Filled 2015-08-07: qty 1

## 2015-08-07 MED ORDER — OXYCODONE-ACETAMINOPHEN 5-325 MG PO TABS: 1.0000 | ORAL_TABLET | ORAL | Status: DC | PRN

## 2015-08-07 NOTE — Progress Notes (Signed)
Patient discharged home with spouse. Discharge instructions, prescriptions and follow up appointment given to and reviewed with patient and spouse. Patient verbalized understanding. Escorted out via wheelchair by Perley Jain, RN.

## 2015-08-07 NOTE — Discharge Summary (Signed)
Physician Discharge Summary  Patient ID: UMME HOST MRN: [id number] DOB/AGE: [birth year] 45 y.o.  Admit date: 08/04/2015 Discharge date: 08/07/2015  Admission Diagnoses:  Abdominal pain after Cholecystectomy  Discharge Diagnoses:  Active Problems:   Abdominal pain   Bile leak, postoperative   Disease of biliary tract   Discharged Condition: good  Hospital Course: 45 yr old female with severe abdominal pain POD#7 after Lap chole.  Patient was evaluated and found to bile leak on HIDA.  She underwent ERCP with sphincterotomy and stent placement on 08/06/2015 by Dr. Allen Norris that revealed bile leak from duct of Luschka; also gastric ulcers were seen.  She has been placed on a PPI BID for this.  She now feels much better, is tolerating a regular diet and laboratory values are now in a normal range.    Consults: GI  Significant Diagnostic Studies: HIDA scan : bile leak  Treatments: antibiotics: Levaquin and ERCP  Discharge Exam: Blood pressure 120/65, pulse 88, temperature 98 F (36.7 C), temperature source Oral, resp. rate 18, height 5\' 2"  (1.575 m), weight 169 lb (76.658 kg), last menstrual period 07/29/2015, SpO2 96 %. General appearance: alert, cooperative and no distress GI: soft, non-tender, inicisions c/d/i Extremities: extremities normal, atraumatic, no cyanosis or edema  Disposition: 01-Home or Self Care  Discharge Instructions    Call MD for:  persistant nausea and vomiting    Complete by:  As directed      Call MD for:  redness, tenderness, or signs of infection (pain, swelling, redness, odor or green/yellow discharge around incision site)    Complete by:  As directed      Call MD for:  severe uncontrolled pain    Complete by:  As directed      Call MD for:  temperature >100.4    Complete by:  As directed      Diet general    Complete by:  As directed      Increase activity slowly    Complete by:  As directed      May shower / Bathe    Complete by:  As directed      May walk up steps    Complete by:  As directed      No dressing needed    Complete by:  As directed             Medication List    TAKE these medications        levofloxacin 750 MG tablet  Commonly known as:  LEVAQUIN  Take 1 tablet (750 mg total) by mouth daily.     metroNIDAZOLE 500 MG tablet  Commonly known as:  FLAGYL  Take 1 tablet (500 mg total) by mouth 3 (three) times daily.     ondansetron 4 MG disintegrating tablet  Commonly known as:  ZOFRAN ODT  Take 1 tablet (4 mg total) by mouth every 8 (eight) hours as needed for nausea or vomiting.     oxyCODONE-acetaminophen 5-325 MG tablet  Commonly known as:  PERCOCET/ROXICET  Take 1-2 tablets by mouth every 4 (four) hours as needed for moderate pain.     pantoprazole 40 MG tablet  Commonly known as:  PROTONIX  Take 1 tablet (40 mg total) by mouth 2 (two) times daily.           Follow-up Information    Follow up with Hubbard Robinson, MD On 08/12/2015.   Specialty:  Surgery   Why:  keep previous appointment on 08/12/15  with Dr. Deliah Goody information:   [place] [id number] [phone]       Signed: Hubbard Robinson 08/07/2015, 5:13 PM

## 2015-08-07 NOTE — Progress Notes (Signed)
°   08/07/15 1500  Clinical Encounter Type  Visited With Patient and family together  Visit Type Initial  Referral From Other (Comment)  Consult/Referral To Chaplain  Routine pastoral care visit. [place]

## 2015-08-12 ENCOUNTER — Ambulatory Visit (INDEPENDENT_AMBULATORY_CARE_PROVIDER_SITE_OTHER): Payer: BLUE CROSS/BLUE SHIELD | Admitting: Surgery

## 2015-08-12 ENCOUNTER — Other Ambulatory Visit: Payer: Self-pay

## 2015-08-12 ENCOUNTER — Encounter: Payer: Self-pay | Admitting: Surgery

## 2015-08-12 VITALS — BP 126/77 | HR 98 | Temp 98.0°F | Ht 62.0 in | Wt 168.6 lb

## 2015-08-12 DIAGNOSIS — K810 Acute cholecystitis: Secondary | ICD-10-CM

## 2015-08-12 DIAGNOSIS — K9189 Other postprocedural complications and disorders of digestive system: Secondary | ICD-10-CM

## 2015-08-12 DIAGNOSIS — K838 Other specified diseases of biliary tract: Secondary | ICD-10-CM

## 2015-08-12 DIAGNOSIS — K929 Disease of digestive system, unspecified: Secondary | ICD-10-CM

## 2015-08-12 DIAGNOSIS — K839 Disease of biliary tract, unspecified: Secondary | ICD-10-CM

## 2015-08-12 NOTE — Progress Notes (Signed)
45 year old female status post acute cholecystitis with laparoscopic cholecystectomy done on 07/30/2015. Patient have return to the hospital with a bile leak that was found to be from a duct of loose cup on her ERCP with sphincterotomy and stent placement that was performed on 08/06/2015 by Dr. Allen Norris.  Patient was then discharged from the hospital a couple of days later. Patient states that she still having some right upper quadrant pain but that she is able to eat without nausea or vomiting. Patient states that she doesn't have much of appetite and that she does feel full very quickly. Patient also states that she is having some diarrhea almost every time she eats. Patient denies any fever chills nausea vomiting or increased abdominal pain and jaundice or dysuria.  Filed Vitals:   08/12/15 1551  BP: 126/77  Pulse: 98  Temp: 98 F (36.7 C)   PE:  Gen: NAD Abd: incisions c/d/i with glue in place, no erythema or drainage, tenderness in RUQ and RLQ, no periotonitis, soft, non-distended Ext: 2+ pulses, no edema  A/P:  Patient is healing well after having a laparoscopic cholecystectomy and then a leakage requiring ERCP and stent placement. Patient is still having some complaints of pain in the right upper quadrant along the right lower quadrant from where the bile leak was. However she's not having any further fever chills and no signs of jaundice on physical exam. Dysphagia the patient that the diarrhea should improved over the next 4-6 weeks as well as her appetite and energy should improve. 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 Patient will have a follow-up appointment with Dr. Allen Norris for stent removal in 3 months and to check her gastritis. If she has increasing pain or doesn't feel like things are improving over the next few weeks she is to give [place] a call so that we can get her in.

## 2015-08-12 NOTE — Patient Instructions (Addendum)
You will need to see Dr. Allen Norris to have this stent removed 3 months. This will be on May 2nd at [facility]. Please call [phone] between 1-3pm on May 1st to find out what you're arrival time is. In order to prepare for this, you cannot have anything to eat after midnight the night before your procedure.  Your diarrhea should go away within the next 6 weeks. If this does not get better by this time, call our office and make an appointment. During the meantime, eat small frequent meals to help with the diarrhea.  You may return to work on 09/14/15 without restrictions.   Please call our office and speak with a nurse if you have any problems at all.

## 2015-08-20 ENCOUNTER — Telehealth: Payer: Self-pay | Admitting: Surgery

## 2015-08-20 NOTE — Telephone Encounter (Signed)
Dr. Azalee Course did gallbladder surgery then another for leakage. Now she feels like she has a lot of reflux. Please call.

## 2015-08-20 NOTE — Telephone Encounter (Signed)
Spoke with patient at this time. She states that she has been continuing to have "Indigestion." Denies chest pain, shortness or breath, and nausea/vomiting. Explained to patient that Omeprazole 20mg  OTC- 1 tab daily may help her indigestion. If she still having symptoms after 2 weeks of trying this medication, I would like for her to call back to be placed on Dr. Geoffry Paradise schedule as discussed at last office visit.  She has an EGD scheduled on 11/03/15. Will speak with Dr. Azalee Course about moving this up, if symptoms persist.  Pt was told with her personal and family history, should indigestion become worse, she develops chest pain, shortness or breath, or nausea/vomiting; she needs to be seen immediately in the Emergency Room. She replies with, "Well, I know it's not my heart." I re-iterated this once again to patient. She verbalized understanding but still does not agree with me.

## 2015-08-28 ENCOUNTER — Encounter: Payer: Self-pay | Admitting: Surgery

## 2015-11-03 ENCOUNTER — Ambulatory Visit
Admission: RE | Admit: 2015-11-03 | Discharge: 2015-11-03 | Disposition: A | Discharge status: Home / Self Care | Payer: BLUE CROSS/BLUE SHIELD | Source: Ambulatory Visit | Attending: Gastroenterology | Admitting: Gastroenterology

## 2015-11-03 ENCOUNTER — Ambulatory Visit: Payer: BLUE CROSS/BLUE SHIELD | Admitting: Anesthesiology

## 2015-11-03 ENCOUNTER — Encounter: Payer: Self-pay | Admitting: *Deleted

## 2015-11-03 ENCOUNTER — Encounter
Admission: RE | Disposition: A | Discharge status: Home / Self Care | Payer: Self-pay | Source: Ambulatory Visit | Attending: Gastroenterology

## 2015-11-03 DIAGNOSIS — Z4589 Encounter for adjustment and management of other implanted devices: Secondary | ICD-10-CM | POA: Diagnosis not present

## 2015-11-03 DIAGNOSIS — F17200 Nicotine dependence, unspecified, uncomplicated: Secondary | ICD-10-CM | POA: Diagnosis not present

## 2015-11-03 DIAGNOSIS — Z4659 Encounter for fitting and adjustment of other gastrointestinal appliance and device: Secondary | ICD-10-CM | POA: Diagnosis not present

## 2015-11-03 DIAGNOSIS — K838 Other specified diseases of biliary tract: Secondary | ICD-10-CM | POA: Insufficient documentation

## 2015-11-03 DIAGNOSIS — Z8041 Family history of malignant neoplasm of ovary: Secondary | ICD-10-CM | POA: Insufficient documentation

## 2015-11-03 DIAGNOSIS — J45909 Unspecified asthma, uncomplicated: Secondary | ICD-10-CM | POA: Diagnosis not present

## 2015-11-03 DIAGNOSIS — Z8542 Personal history of malignant neoplasm of other parts of uterus: Secondary | ICD-10-CM | POA: Diagnosis not present

## 2015-11-03 HISTORY — PX: ERCP: SHX5425

## 2015-11-03 SURGERY — ERCP, WITH INTERVENTION IF INDICATED
Anesthesia: General

## 2015-11-03 MED ORDER — GLYCOPYRROLATE 0.2 MG/ML IJ SOLN
INTRAMUSCULAR | Status: DC | PRN
  Administered 2015-11-03: 0.2 mg via INTRAVENOUS

## 2015-11-03 MED ORDER — LIDOCAINE HCL (CARDIAC) 20 MG/ML IV SOLN
INTRAVENOUS | Status: DC | PRN
  Administered 2015-11-03: 50 mg via INTRAVENOUS

## 2015-11-03 MED ORDER — MIDAZOLAM HCL 5 MG/5ML IJ SOLN
INTRAMUSCULAR | Status: DC | PRN
  Administered 2015-11-03: 1 mg via INTRAVENOUS

## 2015-11-03 MED ORDER — PROPOFOL 10 MG/ML IV BOLUS
INTRAVENOUS | Status: DC | PRN
  Administered 2015-11-03: 70 mg via INTRAVENOUS
  Administered 2015-11-03: 30 mg via INTRAVENOUS

## 2015-11-03 MED ORDER — PROPOFOL 500 MG/50ML IV EMUL
INTRAVENOUS | Status: DC | PRN
  Administered 2015-11-03: 120 ug/kg/min via INTRAVENOUS

## 2015-11-03 MED ORDER — SODIUM CHLORIDE 0.9 % IV SOLN
INTRAVENOUS | Status: DC
  Administered 2015-11-03: 09:00:00 via INTRAVENOUS

## 2015-11-03 MED ORDER — FENTANYL CITRATE (PF) 100 MCG/2ML IJ SOLN
INTRAMUSCULAR | Status: DC | PRN
  Administered 2015-11-03: 50 ug via INTRAVENOUS

## 2015-11-03 NOTE — Anesthesia Preprocedure Evaluation (Signed)
Anesthesia Evaluation  Patient identified by MRN, date of birth, ID band Patient awake    Reviewed: Allergy & Precautions, NPO status , Patient's Chart, lab work & pertinent test results  Airway Mallampati: II       Dental  (+) Poor Dentition   Pulmonary asthma , Current Smoker,     + decreased breath sounds      Cardiovascular Exercise Tolerance: Good  Rhythm:Regular     Neuro/Psych  Headaches,    GI/Hepatic negative GI ROS, Neg liver ROS,   Endo/Other  negative endocrine ROS  Renal/GU negative Renal ROS     Musculoskeletal   Abdominal   Peds  Hematology negative hematology ROS (+)   Anesthesia Other Findings   Reproductive/Obstetrics                             Anesthesia Physical Anesthesia Plan  ASA: II  Anesthesia Plan: General   Post-op Pain Management:    Induction: Intravenous  Airway Management Planned: Natural Airway and Nasal Cannula  Additional Equipment:   Intra-op Plan:   Post-operative Plan:   Informed Consent: I have reviewed the patients History and Physical, chart, labs and discussed the procedure including the risks, benefits and alternatives for the proposed anesthesia with the patient or authorized representative who has indicated his/her understanding and acceptance.     Plan Discussed with: CRNA  Anesthesia Plan Comments:         Anesthesia Quick Evaluation

## 2015-11-03 NOTE — Op Note (Signed)
[facility] Gastroenterology Patient Name: Sydney Wilson Procedure Date: 11/03/2015 10:05 AM MRN: [id number] Account #: [id number] Date of Birth: [birth year] Admit Type: Outpatient Age: 45 Room: [facility] ENDO ROOM 4 Gender: Female Note Status: Finalized Procedure:            ERCP Indications:          Biliary stent removal Providers:            Lucilla Lame, MD Medicines:            Propofol per Anesthesia Complications:        No immediate complications. Procedure:            Pre-Anesthesia Assessment:                       - Prior to the procedure, a History and Physical was                        performed, and patient medications and allergies were                        reviewed. The patient's tolerance of previous                        anesthesia was also reviewed. The risks and benefits of                        the procedure and the sedation options and risks were                        discussed with the patient. All questions were                        answered, and informed consent was obtained. Prior                        Anticoagulants: The patient has taken no previous                        anticoagulant or antiplatelet agents. ASA Grade                        Assessment: II - A patient with mild systemic disease.                        After reviewing the risks and benefits, the patient was                        deemed in satisfactory condition to undergo the                        procedure.                       After obtaining informed consent, the scope was passed                        under direct vision. Throughout the procedure, the                        patient's blood pressure, pulse, and oxygen  saturations                        were monitored continuously. The Endosonoscope was                        introduced through the mouth, and used to inject                        contrast into and used to inject contrast into the bile            duct. The ERCP was accomplished without difficulty. The                        patient tolerated the procedure well. Findings:      A biliary stent was visible on the scout film. The esophagus was       successfully intubated under direct vision. The scope was advanced to a       normal major papilla in the descending duodenum without detailed       examination of the pharynx, larynx and associated structures, and upper       GI tract. The upper GI tract was grossly normal. The bile duct was       deeply cannulated. Contrast was injected. I personally interpreted the       bile duct images. There was brisk flow of contrast through the ducts.       Image quality was excellent. Contrast extended to the hepatic ducts. One       stent was removed from the biliary tree using a snare. A wire was passed       into the biliary tree. The biliary tree was swept with a 15 mm balloon       starting at the bifurcation. Sludge was swept from the duct. Impression:           - One stent was removed from the biliary tree.                       - The biliary tree was swept and sludge was found.                       - No further bile duct leak. Recommendation:       - Watch for pancreatitis, bleeding, perforation, and                        cholangitis. Procedure Code(s):    --- Professional ---                       [phone], Endoscopic retrograde cholangiopancreatography                        (ERCP); with removal of foreign body(s) or stent(s)                        from biliary/pancreatic duct(s)                       43264, Endoscopic retrograde cholangiopancreatography                        (ERCP); with removal of calculi/debris from  biliary/pancreatic duct(s)                       [id number], Endoscopic catheterization of the biliary ductal                        system, radiological supervision and interpretation Diagnosis Code(s):    --- Professional ---                        [id number], Encounter for fitting and adjustment of other                        gastrointestinal appliance and device CPT copyright 2016 [employer] Association. All rights reserved. The codes documented in this report are preliminary and upon coder review may  be revised to meet current compliance requirements. Lucilla Lame, MD 11/03/2015 10:38:15 AM This report has been signed electronically. Number of Addenda: 0 Note Initiated On: 11/03/2015 10:05 AM      [facility]

## 2015-11-03 NOTE — Transfer of Care (Signed)
Immediate Anesthesia Transfer of Care Note  Patient: Sydney Wilson  Procedure(s) Performed: Procedure(s): ENDOSCOPIC RETROGRADE CHOLANGIOPANCREATOGRAPHY (ERCP) stent removal (N/A)  Patient Location: Endoscopy Unit  Anesthesia Type:General  Level of Consciousness: awake  Airway & Oxygen Therapy: Patient Spontanous Breathing and Patient connected to nasal cannula oxygen  Post-op Assessment: Report given to RN  Post vital signs: Reviewed  Last Vitals:  Filed Vitals:   11/03/15 0903 11/03/15 1042  BP: 122/81 106/82  Pulse: 81 88  Temp: 35.8 C 36.1 C  Resp: 18 18    Last Pain: There were no vitals filed for this visit.       Complications: No apparent anesthesia complications

## 2015-11-03 NOTE — Anesthesia Postprocedure Evaluation (Signed)
Anesthesia Post Note  Patient: SHARYLE VANDERBERG  Procedure(s) Performed: Procedure(s) (LRB): ENDOSCOPIC RETROGRADE CHOLANGIOPANCREATOGRAPHY (ERCP) stent removal (N/A)  Patient location during evaluation: PACU Anesthesia Type: General Level of consciousness: awake Pain management: pain level controlled Vital Signs Assessment: post-procedure vital signs reviewed and stable Respiratory status: spontaneous breathing Cardiovascular status: blood pressure returned to baseline Anesthetic complications: no    Last Vitals:  Filed Vitals:   11/03/15 0903 11/03/15 1042  BP: 122/81 106/82  Pulse: 81 88  Temp: 35.8 C 36.1 C  Resp: 18 33    Last Pain: There were no vitals filed for this visit.               VAN STAVEREN,Jorge Retz

## 2015-11-03 NOTE — H&P (Signed)
[facility]  [address] [place], [place] [address] Phone: [phone] Fax : [phone]  Primary Care Physician:  No PCP Per Patient Primary Gastroenterologist:  Dr. Allen Norris  Pre-Procedure History & Physical: HPI:  Sydney Wilson is a 45 y.o. female is here for an ERCP.   Past Medical History  Diagnosis Date  . Migraines   . Cancer [facility]) 1997    Uterine    Past Surgical History  Procedure Laterality Date  . Carpel tunnel release  2005  . Minor release dorsal compartment (dequervains) Right 2005  . Cholecystectomy  07/30/2015  . Cholecystectomy N/A 07/30/2015    Procedure: LAPAROSCOPIC CHOLECYSTECTOMY;  Surgeon: Jules Husbands, MD;  Location: [facility] ORS;  Service: General;  Laterality: N/A;  . Ercp N/A 08/06/2015    Procedure: ENDOSCOPIC RETROGRADE CHOLANGIOPANCREATOGRAPHY (ERCP);  Surgeon: Lucilla Lame, MD;  Location: [facility] ENDOSCOPY;  Service: Endoscopy;  Laterality: N/A;  . Nasal sinus surgery  2006    Prior to Admission medications   Medication Sig Start Date End Date Taking? Authorizing Provider  levofloxacin (LEVAQUIN) 750 MG tablet Take 1 tablet (750 mg total) by mouth daily. Patient not taking: Reported on 11/03/2015 08/07/15   Hubbard Robinson, MD  metroNIDAZOLE (FLAGYL) 500 MG tablet Take 1 tablet (500 mg total) by mouth 3 (three) times daily. Patient not taking: Reported on 11/03/2015 08/07/15   Hubbard Robinson, MD  oxyCODONE-acetaminophen (PERCOCET/ROXICET) 5-325 MG tablet Take 1-2 tablets by mouth every 4 (four) hours as needed for moderate pain. 08/07/15   Hubbard Robinson, MD  pantoprazole (PROTONIX) 40 MG tablet Take 1 tablet (40 mg total) by mouth 2 (two) times daily. 08/07/15   Hubbard Robinson, MD    Allergies as of 08/12/2015 - Review Complete 08/12/2015  Allergen Reaction Noted  . Prednisone Shortness Of Breath 07/30/2015  . Amoxicillin Rash 08/05/2015  . Avelox [moxifloxacin hcl in nacl] Rash 07/30/2015  . Penicillins Rash 07/30/2015  .  Vantin [cefpodoxime] Rash 07/30/2015    Family History  Problem Relation Age of Onset  . Cancer Mother     Liver  . Heart disease Father   . Diabetes Father   . Diabetes Sister   . Atrial fibrillation Sister   . Cancer Sister     Ovarian- x 3 sisters  . Diabetes Brother   . Heart disease Paternal Grandmother   . Diabetes Paternal Grandfather     Social History   Social History  . Marital Status: Married    Spouse Name: N/A  . Number of Children: N/A  . Years of Education: N/A   Occupational History  . Not on file.   Social History Main Topics  . Smoking status: Current Every Day Smoker -- 0.25 packs/day    Types: Cigarettes  . Smokeless tobacco: Never Used  . Alcohol Use: No  . Drug Use: No  . Sexual Activity: Not on file   Other Topics Concern  . Not on file   Social History Narrative    Review of Systems: See HPI, otherwise negative ROS  Physical Exam: LMP  General:   Alert,  pleasant and cooperative in NAD Head:  Normocephalic and atraumatic. Neck:  Supple; no masses or thyromegaly. Lungs:  Clear throughout to auscultation.    Heart:  Regular rate and rhythm. Abdomen:  Soft, nontender and nondistended. Normal bowel sounds, without guarding, and without rebound.   Neurologic:  Alert and  oriented x4;  grossly normal neurologically.  Impression/Plan: Sydney Wilson  is here for an ERCP to be performed for stent removal for bile leak.  Risks, benefits, limitations, and alternatives regarding  ERCP have been reviewed with the patient.  Questions have been answered.  All parties agreeable.   Lucilla Lame, MD  11/03/2015, 9:02 AM

## 2015-11-06 ENCOUNTER — Encounter: Payer: Self-pay | Admitting: Gastroenterology

## 2016-11-08 ENCOUNTER — Emergency Department: Payer: Self-pay

## 2016-11-08 ENCOUNTER — Emergency Department
Admission: EM | Admit: 2016-11-08 | Discharge: 2016-11-08 | Disposition: A | Discharge status: Home / Self Care | Payer: Self-pay | Attending: Student in an Organized Health Care Education/Training Program | Admitting: Student in an Organized Health Care Education/Training Program

## 2016-11-08 ENCOUNTER — Encounter: Payer: Self-pay | Admitting: Emergency Medicine

## 2016-11-08 DIAGNOSIS — F17210 Nicotine dependence, cigarettes, uncomplicated: Secondary | ICD-10-CM | POA: Insufficient documentation

## 2016-11-08 DIAGNOSIS — R1011 Right upper quadrant pain: Secondary | ICD-10-CM | POA: Insufficient documentation

## 2016-11-08 DIAGNOSIS — J45909 Unspecified asthma, uncomplicated: Secondary | ICD-10-CM | POA: Insufficient documentation

## 2016-11-08 HISTORY — DX: Unspecified asthma, uncomplicated: J45.909

## 2016-11-08 LAB — URINALYSIS, COMPLETE (UACMP) WITH MICROSCOPIC
BACTERIA UA: NONE SEEN
BILIRUBIN URINE: NEGATIVE
Glucose, UA: NEGATIVE mg/dL
HGB URINE DIPSTICK: NEGATIVE
Ketones, ur: NEGATIVE mg/dL
Leukocytes, UA: NEGATIVE
Nitrite: NEGATIVE
PROTEIN: NEGATIVE mg/dL
Specific Gravity, Urine: 1.011 (ref 1.005–1.030)
pH: 7 (ref 5.0–8.0)

## 2016-11-08 LAB — COMPREHENSIVE METABOLIC PANEL
ALBUMIN: 4.6 g/dL (ref 3.5–5.0)
ALK PHOS: 76 U/L (ref 38–126)
ALT: 19 U/L (ref 14–54)
AST: 30 U/L (ref 15–41)
Anion gap: 8 (ref 5–15)
BUN: 11 mg/dL (ref 6–20)
CALCIUM: 9.6 mg/dL (ref 8.9–10.3)
CHLORIDE: 105 mmol/L (ref 101–111)
CO2: 24 mmol/L (ref 22–32)
Creatinine, Ser: 0.46 mg/dL (ref 0.44–1.00)
GFR calc non Af Amer: >60 mL/min (ref >60)
GLUCOSE: 147 mg/dL — AB (ref 65–99)
Potassium: 3.3 mmol/L — ABNORMAL LOW (ref 3.5–5.1)
SODIUM: 137 mmol/L (ref 135–145)
Total Bilirubin: 0.5 mg/dL (ref 0.3–1.2)
Total Protein: 7.8 g/dL (ref 6.5–8.1)

## 2016-11-08 LAB — CBC
HEMATOCRIT: 41.5 % (ref 35.0–47.0)
HEMOGLOBIN: 14.3 g/dL (ref 12.0–16.0)
MCH: 30.5 pg (ref 26.0–34.0)
MCHC: 34.5 g/dL (ref 32.0–36.0)
MCV: 88.2 fL (ref 80.0–100.0)
Platelets: 281 10*3/uL (ref 150–440)
RBC: 4.7 MIL/uL (ref 3.80–5.20)
RDW: 13.3 % (ref 11.5–14.5)
WBC: 13 10*3/uL — ABNORMAL HIGH (ref 3.6–11.0)

## 2016-11-08 LAB — POCT PREGNANCY, URINE: PREG TEST UR: NEGATIVE

## 2016-11-08 LAB — LIPASE, BLOOD: LIPASE: 21 U/L (ref 11–51)

## 2016-11-08 MED ORDER — MORPHINE SULFATE (PF) 4 MG/ML IV SOLN
4.0000 mg | INTRAVENOUS | Status: DC | PRN
  Administered 2016-11-08: 4 mg via INTRAVENOUS
  Filled 2016-11-08: qty 1

## 2016-11-08 MED ORDER — PROMETHAZINE HCL 12.5 MG PO TABS: 12.5000 mg | ORAL_TABLET | Freq: Four times a day (QID) | ORAL | 0 refills | Status: DC | PRN

## 2016-11-08 MED ORDER — IOPAMIDOL (ISOVUE-300) INJECTION 61%
100.0000 mL | Freq: Once | INTRAVENOUS | Status: AC | PRN
  Administered 2016-11-08: 100 mL via INTRAVENOUS
  Filled 2016-11-08: qty 100

## 2016-11-08 MED ORDER — ONDANSETRON HCL 4 MG/2ML IJ SOLN
4.0000 mg | Freq: Once | INTRAMUSCULAR | Status: AC
  Administered 2016-11-08: 4 mg via INTRAVENOUS
  Filled 2016-11-08: qty 2

## 2016-11-08 MED ORDER — DICYCLOMINE HCL 10 MG PO CAPS: 10.0000 mg | ORAL_CAPSULE | Freq: Three times a day (TID) | ORAL | 0 refills | Status: DC | PRN

## 2016-11-08 MED ORDER — SODIUM CHLORIDE 0.9 % IV BOLUS (SEPSIS)
500.0000 mL | Freq: Once | INTRAVENOUS | Status: AC
  Administered 2016-11-08: 500 mL via INTRAVENOUS

## 2016-11-08 NOTE — Discharge Instructions (Signed)

## 2016-11-08 NOTE — ED Notes (Signed)
Patient transported to CT 

## 2016-11-08 NOTE — ED Provider Notes (Signed)
[facility] Emergency Department Provider Note    First MD Initiated Contact with Patient 11/08/16 1402     (approximate)  I have reviewed the triage vital signs and the nursing notes.   HISTORY  Chief Complaint Nausea    HPI Sydney Wilson is a 46 y.o. female presents with 24 hours of waxing and waning right upper quadrant pain is currently 5 out of 10 in severity. 10 out of 10 in severity at maximum. No association with eating. No measured fevers. Has had this pain before was related to a bile leak after she had a laparoscopic cholecystectomy. Denies any shortness of breath. No cough. No dysuria. No flank pain. Has not had any vomiting but has felt nauseated with the pain.   Past Medical History:  Diagnosis Date  . Asthma   . Cancer ([place]) 1997   Uterine  . Migraines    Family History  Problem Relation Age of Onset  . Cancer Mother     Liver  . Heart disease Father   . Diabetes Father   . Diabetes Sister   . Atrial fibrillation Sister   . Cancer Sister     Ovarian- x 3 sisters  . Diabetes Brother   . Heart disease Paternal Grandmother   . Diabetes Paternal Grandfather    Past Surgical History:  Procedure Laterality Date  . Carpel Tunnel release  2005  . CHOLECYSTECTOMY  07/30/2015  . CHOLECYSTECTOMY N/A 07/30/2015   Procedure: LAPAROSCOPIC CHOLECYSTECTOMY;  Surgeon: Jules Husbands, MD;  Location: [facility] ORS;  Service: General;  Laterality: N/A;  . ERCP N/A 08/06/2015   Procedure: ENDOSCOPIC RETROGRADE CHOLANGIOPANCREATOGRAPHY (ERCP);  Surgeon: Lucilla Lame, MD;  Location: [facility] ENDOSCOPY;  Service: Endoscopy;  Laterality: N/A;  . ERCP N/A 11/03/2015   Procedure: ENDOSCOPIC RETROGRADE CHOLANGIOPANCREATOGRAPHY (ERCP) stent removal;  Surgeon: Lucilla Lame, MD;  Location: [facility] ENDOSCOPY;  Service: Endoscopy;  Laterality: N/A;  . MINOR RELEASE DORSAL COMPARTMENT (DEQUERVAINS) Right 2005  . NASAL SINUS SURGERY  2006   Patient Active Problem List   Diagnosis Date Noted  . Fitting and adjustment of intestinal appliance and device   . Bile duct leak       Prior to Admission medications   Medication Sig Start Date End Date Taking? Authorizing Provider  dicyclomine (BENTYL) 10 MG capsule Take 1 capsule (10 mg total) by mouth 3 (three) times daily as needed for spasms. 11/08/16 11/22/16  Merlyn Lot, MD  levofloxacin (LEVAQUIN) 750 MG tablet Take 1 tablet (750 mg total) by mouth daily. Patient not taking: Reported on 11/03/2015 08/07/15   Hubbard Odaliz Mcqueary, MD  metroNIDAZOLE (FLAGYL) 500 MG tablet Take 1 tablet (500 mg total) by mouth 3 (three) times daily. Patient not taking: Reported on 11/03/2015 08/07/15   Hubbard Vaden Becherer, MD  oxyCODONE-acetaminophen (PERCOCET/ROXICET) 5-325 MG tablet Take 1-2 tablets by mouth every 4 (four) hours as needed for moderate pain. 08/07/15   Hubbard Brayson Livesey, MD  pantoprazole (PROTONIX) 40 MG tablet Take 1 tablet (40 mg total) by mouth 2 (two) times daily. 08/07/15   Hubbard Keondria Siever, MD  promethazine (PHENERGAN) 12.5 MG tablet Take 1 tablet (12.5 mg total) by mouth every 6 (six) hours as needed for nausea or vomiting. 11/08/16   Merlyn Lot, MD    Allergies Prednisone; Amoxicillin; Avelox [moxifloxacin hcl in nacl]; Penicillins; and Vantin [cefpodoxime]    Social History Social History  Substance Use Topics  . Smoking status: Current Every Day Smoker  Packs/day: 0.25    Types: Cigarettes  . Smokeless tobacco: Never Used  . Alcohol use No    Review of Systems Patient denies headaches, rhinorrhea, blurry vision, numbness, shortness of breath, chest pain, edema, cough, abdominal pain, nausea, vomiting, diarrhea, dysuria, fevers, rashes or hallucinations unless otherwise stated above in HPI. ____________________________________________   PHYSICAL EXAM:  VITAL SIGNS: Vitals:   11/08/16 1328  BP: (!) 136/93  Pulse: 86  Resp: 16  Temp: 98.3 F (36.8 C)    Constitutional: Alert  and oriented.  in no acute distress. Eyes: Conjunctivae are normal. PERRL. EOMI. Head: Atraumatic. Nose: No congestion/rhinnorhea. Mouth/Throat: Mucous membranes are moist.  Oropharynx non-erythematous. Neck: No stridor. Painless ROM. No cervical spine tenderness to palpation Hematological/Lymphatic/Immunilogical: No cervical lymphadenopathy. Cardiovascular: Normal rate, regular rhythm. Grossly normal heart sounds.  Good peripheral circulation. Respiratory: Normal respiratory effort.  No retractions. Lungs CTAB. Gastrointestinal: Soft with mild ruq ttp.  No rebound or guarding. No distention. No abdominal bruits. No CVA tenderness. Genitourinary:  Musculoskeletal: No lower extremity tenderness nor edema.  No joint effusions. Neurologic:  Normal speech and language. No gross focal neurologic deficits are appreciated. No gait instability. Skin:  Skin is warm, dry and intact. No rash noted. Psychiatric: Mood and affect are normal. Speech and behavior are normal.  ____________________________________________   LABS (all labs ordered are listed, but only abnormal results are displayed)  Results for orders placed or performed during the hospital encounter of 11/08/16 (from the past 24 hour(s))  Lipase, blood     Status: None   Collection Time: 11/08/16  1:29 PM  Result Value Ref Range   Lipase 21 11 - 51 U/L  Comprehensive metabolic panel     Status: Abnormal   Collection Time: 11/08/16  1:29 PM  Result Value Ref Range   Sodium 137 135 - 145 mmol/L   Potassium 3.3 (L) 3.5 - 5.1 mmol/L   Chloride 105 101 - 111 mmol/L   CO2 24 22 - 32 mmol/L   Glucose, Bld 147 (H) 65 - 99 mg/dL   BUN 11 6 - 20 mg/dL   Creatinine, Ser 0.46 0.44 - 1.00 mg/dL   Calcium 9.6 8.9 - 10.3 mg/dL   Total Protein 7.8 6.5 - 8.1 g/dL   Albumin 4.6 3.5 - 5.0 g/dL   AST 30 15 - 41 U/L   ALT 19 14 - 54 U/L   Alkaline Phosphatase 76 38 - 126 U/L   Total Bilirubin 0.5 0.3 - 1.2 mg/dL   GFR calc non Af Amer >60 >60  mL/min   GFR calc Af Amer >60 >60 mL/min   Anion gap 8 5 - 15  CBC     Status: Abnormal   Collection Time: 11/08/16  1:29 PM  Result Value Ref Range   WBC 13.0 (H) 3.6 - 11.0 K/uL   RBC 4.70 3.80 - 5.20 MIL/uL   Hemoglobin 14.3 12.0 - 16.0 g/dL   HCT 41.5 35.0 - 47.0 %   MCV 88.2 80.0 - 100.0 fL   MCH 30.5 26.0 - 34.0 pg   MCHC 34.5 32.0 - 36.0 g/dL   RDW 13.3 11.5 - 14.5 %   Platelets 281 150 - 440 K/uL  Urinalysis, Complete w Microscopic     Status: Abnormal   Collection Time: 11/08/16  1:30 PM  Result Value Ref Range   Color, Urine YELLOW (A) YELLOW   APPearance CLEAR (A) CLEAR   Specific Gravity, Urine 1.011 1.005 - 1.030   pH 7.0  5.0 - 8.0   Glucose, UA NEGATIVE NEGATIVE mg/dL   Hgb urine dipstick NEGATIVE NEGATIVE   Bilirubin Urine NEGATIVE NEGATIVE   Ketones, ur NEGATIVE NEGATIVE mg/dL   Protein, ur NEGATIVE NEGATIVE mg/dL   Nitrite NEGATIVE NEGATIVE   Leukocytes, UA NEGATIVE NEGATIVE   RBC / HPF 0-5 0 - 5 RBC/hpf   WBC, UA 0-5 0 - 5 WBC/hpf   Bacteria, UA NONE SEEN NONE SEEN   Squamous Epithelial / LPF 0-5 (A) NONE SEEN  Pregnancy, urine POC     Status: None   Collection Time: 11/08/16  3:59 PM  Result Value Ref Range   Preg Test, Ur NEGATIVE NEGATIVE   ____________________________________________  EKG ____________________________________________  RADIOLOGY  I personally reviewed all radiographic images ordered to evaluate for the above acute complaints and reviewed radiology reports and findings.  These findings were personally discussed with the patient.  Please see medical record for radiology report.  ____________________________________________   PROCEDURES  Procedure(s) performed:  Procedures    Critical Care performed: no ____________________________________________   INITIAL IMPRESSION / ASSESSMENT AND PLAN / ED COURSE  Pertinent labs & imaging results that were available during my care of the patient were reviewed by me and considered in  my medical decision making (see chart for details).  DDX: bile leak, biliary spasm, hepatitis, pancreatitis, perforation, diverticulitis  Sydney Wilson is a 46 y.o. who presents to the ED with right upper quadrant pain as described above. Patient is AFVSS in ED. Exam as above. Given current presentation have considered the above differential.  Patient with mild leukocytosis. Biliary labs are reassuring. Based on site of pain and, located postop. One year ago do feel that CT imaging clinically indicated to evaluate for intra-abdominal complication.  The patient will be placed on continuous pulse oximetry and telemetry for monitoring.  Laboratory evaluation will be sent to evaluate for the above complaints.      Clinical Course as of Nov 09 1646  Tue Nov 08, 2016  1646 Discussed results of CT imaging with patient. At this point is suspect large component of this is secondary to pain from muscular origin. Do not feel patient requires further diagnostic testing at this point. Is stable for further follow-up with her PCP.  Patient was able to tolerate PO and was able to ambulate with a steady gait.  Have discussed with the patient and available family all diagnostics and treatments performed thus far and all questions were answered to the best of my ability. The patient demonstrates understanding and agreement with plan.   [PR]    Clinical Course User Index [PR] Merlyn Lot, MD     ____________________________________________   FINAL CLINICAL IMPRESSION(S) / ED DIAGNOSES  Final diagnoses:  RUQ abdominal pain      NEW MEDICATIONS STARTED DURING THIS VISIT:  New Prescriptions   DICYCLOMINE (BENTYL) 10 MG CAPSULE    Take 1 capsule (10 mg total) by mouth 3 (three) times daily as needed for spasms.   PROMETHAZINE (PHENERGAN) 12.5 MG TABLET    Take 1 tablet (12.5 mg total) by mouth every 6 (six) hours as needed for nausea or vomiting.     Note:  This document was prepared using  Dragon voice recognition software and may include unintentional dictation errors.    Merlyn Lot, MD 11/08/16 [phone]

## 2016-11-08 NOTE — ED Notes (Signed)
Pt called sister to come pick her up; pt ambulated independently to lobby at this time to wait on ride.

## 2016-11-08 NOTE — ED Triage Notes (Signed)
RUQ pain x 1 day.  Denies emesis.  C/O nausea.

## 2017-01-06 ENCOUNTER — Emergency Department
Admission: EM | Admit: 2017-01-06 | Discharge: 2017-01-06 | Disposition: A | Discharge status: Home / Self Care | Payer: BLUE CROSS/BLUE SHIELD | Attending: Emergency Medicine | Admitting: Emergency Medicine

## 2017-01-06 ENCOUNTER — Encounter: Payer: Self-pay | Admitting: Emergency Medicine

## 2017-01-06 DIAGNOSIS — J45909 Unspecified asthma, uncomplicated: Secondary | ICD-10-CM | POA: Insufficient documentation

## 2017-01-06 DIAGNOSIS — Y9301 Activity, walking, marching and hiking: Secondary | ICD-10-CM | POA: Insufficient documentation

## 2017-01-06 DIAGNOSIS — Z79899 Other long term (current) drug therapy: Secondary | ICD-10-CM | POA: Insufficient documentation

## 2017-01-06 DIAGNOSIS — S20161A Insect bite (nonvenomous) of breast, right breast, initial encounter: Secondary | ICD-10-CM | POA: Insufficient documentation

## 2017-01-06 DIAGNOSIS — Y999 Unspecified external cause status: Secondary | ICD-10-CM | POA: Insufficient documentation

## 2017-01-06 DIAGNOSIS — Y92007 Garden or yard of unspecified non-institutional (private) residence as the place of occurrence of the external cause: Secondary | ICD-10-CM | POA: Insufficient documentation

## 2017-01-06 DIAGNOSIS — F17210 Nicotine dependence, cigarettes, uncomplicated: Secondary | ICD-10-CM | POA: Insufficient documentation

## 2017-01-06 DIAGNOSIS — L089 Local infection of the skin and subcutaneous tissue, unspecified: Secondary | ICD-10-CM | POA: Insufficient documentation

## 2017-01-06 DIAGNOSIS — B9689 Other specified bacterial agents as the cause of diseases classified elsewhere: Secondary | ICD-10-CM

## 2017-01-06 DIAGNOSIS — W57XXXA Bitten or stung by nonvenomous insect and other nonvenomous arthropods, initial encounter: Secondary | ICD-10-CM | POA: Insufficient documentation

## 2017-01-06 DIAGNOSIS — N61 Mastitis without abscess: Secondary | ICD-10-CM

## 2017-01-06 MED ORDER — CLINDAMYCIN PHOSPHATE 300 MG/2ML IJ SOLN: 300.0000 mg | Freq: Once | INTRAMUSCULAR | Status: DC

## 2017-01-06 MED ORDER — CLINDAMYCIN HCL 300 MG PO CAPS: 300.0000 mg | ORAL_CAPSULE | Freq: Three times a day (TID) | ORAL | 0 refills | Status: AC

## 2017-01-06 MED ORDER — CLINDAMYCIN PHOSPHATE 300 MG/2ML IJ SOLN
300.0000 mg | Freq: Once | INTRAMUSCULAR | Status: AC
  Administered 2017-01-06: 300 mg via INTRAMUSCULAR

## 2017-01-06 MED ORDER — CLINDAMYCIN PHOSPHATE 300 MG/2ML IJ SOLN
INTRAMUSCULAR | Status: AC
  Filled 2017-01-06: qty 2

## 2017-01-06 NOTE — Discharge Instructions (Signed)
Return to your primary care provider or the emergency department for a wound recheck in 2 days. Continue to monitor the wound, if symptoms begin to worsen please return to the emergency department.

## 2017-01-06 NOTE — ED Triage Notes (Signed)
Pt reports abscess to rt breast

## 2017-01-06 NOTE — ED Provider Notes (Signed)
[facility] Emergency Department Provider Note   ____________________________________________   I have reviewed the triage vital signs and the nursing notes.   HISTORY  Chief Complaint Abscess    HPI Sydney Wilson is a 46 y.o. female presents to the emergency department with a wound along the underneath aspect of her right breast that developed 4 days ago. Patient was walking in the yard and stepped through a spider web she believes she sustained an insect bite. The area began as a very small bite wound and quickly developed into an open wound, greenish yellow drainage, erythema and pain. Patient was seen at an urgent care in [place] where she was prescribed erythromycin. The provider there referred her to the emergency department for further care. Patient reports that she feels like she's been afebrile since the onset of the wound. Patient denies fever, chills, headache, vision changes, chest pain, chest tightness, shortness of breath, abdominal pain, nausea and vomiting.  Past Medical History:  Diagnosis Date  . Asthma   . Cancer ([place]) 1997   Uterine  . Migraines     Patient Active Problem List   Diagnosis Date Noted  . Fitting and adjustment of intestinal appliance and device   . Bile duct leak     Past Surgical History:  Procedure Laterality Date  . Carpel Tunnel release  2005  . CHOLECYSTECTOMY  07/30/2015  . CHOLECYSTECTOMY N/A 07/30/2015   Procedure: LAPAROSCOPIC CHOLECYSTECTOMY;  Surgeon: Jules Husbands, MD;  Location: [facility] ORS;  Service: General;  Laterality: N/A;  . ERCP N/A 08/06/2015   Procedure: ENDOSCOPIC RETROGRADE CHOLANGIOPANCREATOGRAPHY (ERCP);  Surgeon: Lucilla Lame, MD;  Location[facility] ENDOSCOPY;  Service: Endoscopy;  Laterality: N/A;  . ERCP N/A 11/03/2015   Procedure: ENDOSCOPIC RETROGRADE CHOLANGIOPANCREATOGRAPHY (ERCP) stent removal;  Surgeon: Lucilla Lame, MD;  Location: [facility] ENDOSCOPY;  Service: Endoscopy;  Laterality: N/A;  .  MINOR RELEASE DORSAL COMPARTMENT (DEQUERVAINS) Right 2005  . NASAL SINUS SURGERY  2006    Prior to Admission medications   Medication Sig Start Date End Date Taking? Authorizing Provider  clindamycin (CLEOCIN) 300 MG capsule Take 1 capsule (300 mg total) by mouth 3 (three) times daily. 01/06/17 01/16/17  Little, Traci M, PA-C  dicyclomine (BENTYL) 10 MG capsule Take 1 capsule (10 mg total) by mouth 3 (three) times daily as needed for spasms. 11/08/16 11/22/16  Merlyn Lot, MD  levofloxacin (LEVAQUIN) 750 MG tablet Take 1 tablet (750 mg total) by mouth daily. Patient not taking: Reported on 11/03/2015 08/07/15   Hubbard Robinson, MD  metroNIDAZOLE (FLAGYL) 500 MG tablet Take 1 tablet (500 mg total) by mouth 3 (three) times daily. Patient not taking: Reported on 11/03/2015 08/07/15   Hubbard Robinson, MD  oxyCODONE-acetaminophen (PERCOCET/ROXICET) 5-325 MG tablet Take 1-2 tablets by mouth every 4 (four) hours as needed for moderate pain. 08/07/15   Hubbard Robinson, MD  pantoprazole (PROTONIX) 40 MG tablet Take 1 tablet (40 mg total) by mouth 2 (two) times daily. 08/07/15   Hubbard Robinson, MD  promethazine (PHENERGAN) 12.5 MG tablet Take 1 tablet (12.5 mg total) by mouth every 6 (six) hours as needed for nausea or vomiting. 11/08/16   Merlyn Lot, MD    Allergies Prednisone; Amoxicillin; Avelox [moxifloxacin hcl in nacl]; Penicillins; Sulfa antibiotics; and Vantin [cefpodoxime]  Family History  Problem Relation Age of Onset  . Cancer Mother        Liver  . Heart disease Father   . Diabetes Father   .  Diabetes Sister   . Atrial fibrillation Sister   . Cancer Sister        Ovarian- x 3 sisters  . Diabetes Brother   . Heart disease Paternal Grandmother   . Diabetes Paternal Grandfather     Social History Social History  Substance Use Topics  . Smoking status: Current Every Day Smoker    Packs/day: 0.25    Types: Cigarettes  . Smokeless tobacco: Never Used  . Alcohol use No      Review of Systems Constitutional: Negative for fever/chills Eyes: No visual changes. ENT:  Negative for sore throat. Cardiovascular: Denies chest pain. Respiratory: Denies shortness of breath. Gastrointestinal: No abdominal pain.  No nausea, vomiting, diarrhea. Musculoskeletal:Negative for generalized body aches. Skin: Negative for rash. Open wound along the underneath aspect of the right breast with erythema, drainage and painful. Neurological: Negative for headaches.  ____________________________________________   PHYSICAL EXAM:  VITAL SIGNS: ED Triage Vitals  Enc Vitals Group     BP 01/06/17 1546 (!) 142/88     Pulse Rate 01/06/17 1546 96     Resp 01/06/17 1546 16     Temp --      Temp src --      SpO2 01/06/17 1546 96 %     Weight 01/06/17 1545 180 lb (81.6 kg)     Height 01/06/17 1546 5\' 2"  (1.575 m)     Head Circumference --      Peak Flow --      Pain Score 01/06/17 1546 5     Pain Loc --      Pain Edu? --      Excl. in [place]? --     Constitutional: Alert and oriented. Well appearing and in no acute distress.  Head: Normocephalic and atraumatic. Eyes: Conjunctivae are normal. PERRL.  Hematological/Lymphatic/Immunological: No cervical lymphadenopathy. Cardiovascular: Normal rate, regular rhythm. Normal distal pulses. Respiratory: Normal respiratory effort.  Musculoskeletal: Nontender with normal range of motion in all extremities. Neurologic: Normal speech and language.  Skin:  Skin is warm, dry and intact. No rash noted. Right breast: underneath along the breast fold an open wound with base covered in thickened yellow-greenish drainage. Peri-wound erythematous with induration only noted along the wound perimeter. Wound dimensions: 2.1 cm in diameter, wound base: 1.2 cm diameter. Wound marked with surgical marker.  Psychiatric: Mood and affect are normal.  ____________________________________________   LABS (all labs ordered are listed, but only abnormal results  are displayed)  Labs Reviewed - No data to display ____________________________________________  EKG None ____________________________________________  RADIOLOGY None ____________________________________________   PROCEDURES  Procedure(s) performed: no    Critical Care performed: no ____________________________________________   INITIAL IMPRESSION / ASSESSMENT AND PLAN / ED COURSE  Pertinent labs & imaging results that were available during my care of the patient were reviewed by me and considered in my medical decision making (see chart for details).   Patient presents to emergency department wound along the underneath aspect of the right breast that developed 4 days ago after a likely insect bite.Marland Kitchen History and physical exam findings are reassuring symptoms are consistent with development of cellulitis as result of the insect bite. Patient given an initial dose of clindamycin 300 mg IM during the course of care in emergency department. Patient will continue clindamycin 300 mg 3 times a day once discharged. Recommended patient return in 2 days for a wound check. Wound area was marked with a surgical marker for monitoring. Patient advised to return to the emergency  department if symptoms return or worsen sooner than 2 days if needed. ____________________________________________   FINAL CLINICAL IMPRESSION(S) / ED DIAGNOSES  Final diagnoses:  Insect bite, initial encounter  Skin infection, bacterial  Cellulitis of right breast       NEW MEDICATIONS STARTED DURING THIS VISIT:  New Prescriptions   CLINDAMYCIN (CLEOCIN) 300 MG CAPSULE    Take 1 capsule (300 mg total) by mouth 3 (three) times daily.     Note:  This document was prepared using Dragon voice recognition software and may include unintentional dictation errors.    Jerolyn Shin, PA-C 01/06/17 1648    Nance Pear, MD 01/06/17 [phone]

## 2017-01-06 NOTE — ED Notes (Signed)
See triage note.  States she may have been stung by something couple of days ago .[facility] and [facility]  States today she is having more pain and area is draining

## 2017-01-08 ENCOUNTER — Emergency Department
Admission: EM | Admit: 2017-01-08 | Discharge: 2017-01-08 | Disposition: A | Discharge status: Home / Self Care | Payer: BLUE CROSS/BLUE SHIELD | Attending: Emergency Medicine | Admitting: Emergency Medicine

## 2017-01-08 ENCOUNTER — Encounter: Payer: Self-pay | Admitting: Medical Oncology

## 2017-01-08 DIAGNOSIS — Z79891 Long term (current) use of opiate analgesic: Secondary | ICD-10-CM | POA: Insufficient documentation

## 2017-01-08 DIAGNOSIS — L03313 Cellulitis of chest wall: Secondary | ICD-10-CM | POA: Insufficient documentation

## 2017-01-08 DIAGNOSIS — Z5189 Encounter for other specified aftercare: Secondary | ICD-10-CM | POA: Insufficient documentation

## 2017-01-08 DIAGNOSIS — Z79899 Other long term (current) drug therapy: Secondary | ICD-10-CM | POA: Insufficient documentation

## 2017-01-08 DIAGNOSIS — J45909 Unspecified asthma, uncomplicated: Secondary | ICD-10-CM | POA: Insufficient documentation

## 2017-01-08 NOTE — ED Triage Notes (Signed)
Pt reports here for re-check of abscess that was drained Friday to rt breast.

## 2017-01-08 NOTE — Discharge Instructions (Signed)
You area of cellulitis is healing as expected. Continue to take the antibiotic daily, as directed. Keep the area clean and covered. Follow-up with [facility] or your provider. for wound check as needed.

## 2017-01-10 NOTE — ED Provider Notes (Signed)
[facility] Emergency Department Provider Note ____________________________________________  Time seen: 1202  I have reviewed the triage vital signs and the nursing notes.  HISTORY  Chief Complaint  Wound Check  HPI Sydney Wilson is a 46 y.o. female resistance to the ED for reevaluation of an abscess to the right breast. Patient was evaluated here 2-3 days prior for an superficial abscess to the right breast that wasdrained and dressed. She is being currently denies any adverse effects. She does note continued mild to moderate purulent drainage on her dressings. She denies any fevers, chills, sweats.  Past Medical History:  Diagnosis Date  . Asthma   . Cancer ([place]) 1997   Uterine  . Migraines     Patient Active Problem List   Diagnosis Date Noted  . Fitting and adjustment of intestinal appliance and device   . Bile duct leak     Past Surgical History:  Procedure Laterality Date  . Carpel Tunnel release  2005  . CHOLECYSTECTOMY  07/30/2015  . CHOLECYSTECTOMY N/A 07/30/2015   Procedure: LAPAROSCOPIC CHOLECYSTECTOMY;  Surgeon: Jules Husbands, MD;  Location: [facility] ORS;  Service: General;  Laterality: N/A;  . ERCP N/A 08/06/2015   Procedure: ENDOSCOPIC RETROGRADE CHOLANGIOPANCREATOGRAPHY (ERCP);  Surgeon: Lucilla Lame, MD;  Location: [facility] ENDOSCOPY;  Service: Endoscopy;  Laterality: N/A;  . ERCP N/A 11/03/2015   Procedure: ENDOSCOPIC RETROGRADE CHOLANGIOPANCREATOGRAPHY (ERCP) stent removal;  Surgeon: Lucilla Lame, MD;  Location: [facility] ENDOSCOPY;  Service: Endoscopy;  Laterality: N/A;  . MINOR RELEASE DORSAL COMPARTMENT (DEQUERVAINS) Right 2005  . NASAL SINUS SURGERY  2006    Prior to Admission medications   Medication Sig Start Date End Date Taking? Authorizing Provider  clindamycin (CLEOCIN) 300 MG capsule Take 1 capsule (300 mg total) by mouth 3 (three) times daily. 01/06/17 01/16/17  Little, Traci M, PA-C  dicyclomine (BENTYL) 10 MG capsule Take 1 capsule (10 mg  total) by mouth 3 (three) times daily as needed for spasms. 11/08/16 11/22/16  Merlyn Lot, MD  levofloxacin (LEVAQUIN) 750 MG tablet Take 1 tablet (750 mg total) by mouth daily. Patient not taking: Reported on 11/03/2015 08/07/15   Hubbard Robinson, MD  metroNIDAZOLE (FLAGYL) 500 MG tablet Take 1 tablet (500 mg total) by mouth 3 (three) times daily. Patient not taking: Reported on 11/03/2015 08/07/15   Hubbard Robinson, MD  oxyCODONE-acetaminophen (PERCOCET/ROXICET) 5-325 MG tablet Take 1-2 tablets by mouth every 4 (four) hours as needed for moderate pain. 08/07/15   Hubbard Robinson, MD  pantoprazole (PROTONIX) 40 MG tablet Take 1 tablet (40 mg total) by mouth 2 (two) times daily. 08/07/15   Hubbard Robinson, MD  promethazine (PHENERGAN) 12.5 MG tablet Take 1 tablet (12.5 mg total) by mouth every 6 (six) hours as needed for nausea or vomiting. 11/08/16   Merlyn Lot, MD    Allergies Prednisone; Amoxicillin; Avelox [moxifloxacin hcl in nacl]; Penicillins; Sulfa antibiotics; and Vantin [cefpodoxime]  Family History  Problem Relation Age of Onset  . Cancer Mother        Liver  . Heart disease Father   . Diabetes Father   . Diabetes Sister   . Atrial fibrillation Sister   . Cancer Sister        Ovarian- x 3 sisters  . Diabetes Brother   . Heart disease Paternal Grandmother   . Diabetes Paternal Grandfather     Social History Social History  Substance Use Topics  . Smoking status: Current Every Day Smoker  Packs/day: 0.25    Types: Cigarettes  . Smokeless tobacco: Never Used  . Alcohol use No    Review of Systems  Constitutional: Negative for fever. Cardiovascular: Negative for chest pain. Respiratory: Negative for shortness of breath. Musculoskeletal: Negative for back pain. Skin: Negative for rash. Right breast abscess as above. Neurological: Negative for headaches, focal weakness or numbness. ____________________________________________  PHYSICAL  EXAM:  VITAL SIGNS: ED Triage Vitals  Enc Vitals Group     BP 01/08/17 1100 118/66     Pulse Rate 01/08/17 1100 88     Resp 01/08/17 1100 16     Temp 01/08/17 1100 98.7 F (37.1 C)     Temp Source 01/08/17 1100 Oral     SpO2 01/08/17 1100 99 %     Weight 01/08/17 1058 180 lb (81.6 kg)     Height 01/08/17 1058 5\' 2"  (1.575 m)     Head Circumference --      Peak Flow --      Pain Score 01/08/17 1058 0     Pain Loc --      Pain Edu? --      Excl. in [place]? --     Constitutional: Alert and oriented. Well appearing and in no distress. Head: Normocephalic and atraumatic. Cardiovascular: Normal rate, regular rhythm. Normal distal pulses. Respiratory: Normal respiratory effort. No wheezes/rales/rhonchi. Musculoskeletal: Nontender with normal range of motion in all extremities.  Neurologic:  Normal gait without ataxia. Normal speech and language. No gross focal neurologic deficits are appreciated. Skin:  Skin is warm, dry and intact. No rash noted. Right breast with a well-circumscribed ulcerated lesion noted in the inferior pole of the breast. The ulceration measures about 27 m in diameter. There is central granulation tissue with moderate eschar noted. There is some mild purulent drainage noted on the dressing. No surrounding erythema, edema, or induration is noted. ____________________________________________  PROCEDURES  Saline wound washing Sterile, non-stick bandage applied ____________________________________________  INITIAL IMPRESSION / ASSESSMENT AND PLAN / ED COURSE  Patient with a ED reevaluation of an abscess of right breast. The abscess appears to be healing well. Patient is advised to keep the area clean, dry, and covered. She should continue the antibiotics as prescribed. She should follow-up with [facility] or her primary care provider for interim wound check. ____________________________________________  FINAL CLINICAL IMPRESSION(S) / ED DIAGNOSES  Final diagnoses:   Abscess re-check  Cellulitis of chest wall      Sunshine Mackowski, Dannielle Karvonen, PA-C 01/10/17 [place], [place], MD 01/12/17 1510

## 2017-09-05 ENCOUNTER — Emergency Department
Admission: EM | Admit: 2017-09-05 | Discharge: 2017-09-05 | Disposition: A | Discharge status: Home / Self Care | Payer: PRIVATE HEALTH INSURANCE | Attending: Emergency Medicine | Admitting: Emergency Medicine

## 2017-09-05 ENCOUNTER — Encounter: Payer: Self-pay | Admitting: Medical Oncology

## 2017-09-05 DIAGNOSIS — Z79899 Other long term (current) drug therapy: Secondary | ICD-10-CM | POA: Diagnosis not present

## 2017-09-05 DIAGNOSIS — F17210 Nicotine dependence, cigarettes, uncomplicated: Secondary | ICD-10-CM | POA: Insufficient documentation

## 2017-09-05 DIAGNOSIS — J45909 Unspecified asthma, uncomplicated: Secondary | ICD-10-CM | POA: Diagnosis not present

## 2017-09-05 DIAGNOSIS — R69 Illness, unspecified: Secondary | ICD-10-CM

## 2017-09-05 DIAGNOSIS — J029 Acute pharyngitis, unspecified: Secondary | ICD-10-CM | POA: Diagnosis present

## 2017-09-05 DIAGNOSIS — J111 Influenza due to unidentified influenza virus with other respiratory manifestations: Secondary | ICD-10-CM | POA: Insufficient documentation

## 2017-09-05 LAB — GROUP A STREP BY PCR: Group A Strep by PCR: NOT DETECTED

## 2017-09-05 MED ORDER — GUAIFENESIN-CODEINE 100-10 MG/5ML PO SOLN: 10.0000 mL | Freq: Three times a day (TID) | ORAL | 0 refills | Status: DC | PRN

## 2017-09-05 NOTE — ED Provider Notes (Signed)
[facility] Emergency Department Provider Note  ____________________________________________  Time seen: Approximately 8:27 AM  I have reviewed the triage vital signs and the nursing notes.   HISTORY  Chief Complaint Chills and Sore Throat   HPI Sydney Wilson is a 47 y.o. female who presents to the emergency department for treatment and evaluation of sinus pressure, sore throat, and chills yesterday.  Subjective fever today. No relief with OTC medications.  Past Medical History:  Diagnosis Date  . Asthma   . Cancer ([place]) 1997   Uterine  . Migraines     Patient Active Problem List   Diagnosis Date Noted  . Fitting and adjustment of intestinal appliance and device   . Bile duct leak     Past Surgical History:  Procedure Laterality Date  . Carpel Tunnel release  2005  . CHOLECYSTECTOMY  07/30/2015  . CHOLECYSTECTOMY N/A 07/30/2015   Procedure: LAPAROSCOPIC CHOLECYSTECTOMY;  Surgeon: Jules Husbands, MD;  Location: [facility] ORS;  Service: General;  Laterality: N/A;  . ERCP N/A 08/06/2015   Procedure: ENDOSCOPIC RETROGRADE CHOLANGIOPANCREATOGRAPHY (ERCP);  Surgeon: Lucilla Lame, MD;  Location: [facility] ENDOSCOPY;  Service: Endoscopy;  Laterality: N/A;  . ERCP N/A 11/03/2015   Procedure: ENDOSCOPIC RETROGRADE CHOLANGIOPANCREATOGRAPHY (ERCP) stent removal;  Surgeon: Lucilla Lame, MD;  Location: [facility] ENDOSCOPY;  Service: Endoscopy;  Laterality: N/A;  . MINOR RELEASE DORSAL COMPARTMENT (DEQUERVAINS) Right 2005  . NASAL SINUS SURGERY  2006    Prior to Admission medications   Medication Sig Start Date End Date Taking? Authorizing Provider  dicyclomine (BENTYL) 10 MG capsule Take 1 capsule (10 mg total) by mouth 3 (three) times daily as needed for spasms. 11/08/16 11/22/16  Merlyn Lot, MD  guaiFENesin-codeine 100-10 MG/5ML syrup Take 10 mLs by mouth 3 (three) times daily as needed. 09/05/17   Ryen Rhames, Johnette Abraham B, FNP  levofloxacin (LEVAQUIN) 750 MG tablet Take 1 tablet (750  mg total) by mouth daily. Patient not taking: Reported on 11/03/2015 08/07/15   Hubbard Robinson, MD  metroNIDAZOLE (FLAGYL) 500 MG tablet Take 1 tablet (500 mg total) by mouth 3 (three) times daily. Patient not taking: Reported on 11/03/2015 08/07/15   Hubbard Robinson, MD  oxyCODONE-acetaminophen (PERCOCET/ROXICET) 5-325 MG tablet Take 1-2 tablets by mouth every 4 (four) hours as needed for moderate pain. 08/07/15   Hubbard Robinson, MD  pantoprazole (PROTONIX) 40 MG tablet Take 1 tablet (40 mg total) by mouth 2 (two) times daily. 08/07/15   Hubbard Robinson, MD  promethazine (PHENERGAN) 12.5 MG tablet Take 1 tablet (12.5 mg total) by mouth every 6 (six) hours as needed for nausea or vomiting. 11/08/16   Merlyn Lot, MD    Allergies Prednisone; Amoxicillin; Avelox [moxifloxacin hcl in nacl]; Penicillins; Sulfa antibiotics; and Vantin [cefpodoxime]  Family History  Problem Relation Age of Onset  . Cancer Mother        Liver  . Heart disease Father   . Diabetes Father   . Diabetes Sister   . Atrial fibrillation Sister   . Cancer Sister        Ovarian- x 3 sisters  . Diabetes Brother   . Heart disease Paternal Grandmother   . Diabetes Paternal Grandfather     Social History Social History   Tobacco Use  . Smoking status: Current Every Day Smoker    Packs/day: 0.25    Types: Cigarettes  . Smokeless tobacco: Never Used  Substance Use Topics  . Alcohol use: No  . Drug use:  No    Review of Systems Constitutional: Positive for fever/chills ENT: Positive for sore throat. Cardiovascular: Denies chest pain. Respiratory: Negative for shortness of breath. Negative for cough. Gastrointestinal: Negative for nausea,  no vomiting.  No diarrhea.  Musculoskeletal: Positive for for body aches Skin: Negative for rash. Neurological: Negative for headaches ____________________________________________   PHYSICAL EXAM:  VITAL SIGNS: ED Triage Vitals  Enc Vitals Group     BP  09/05/17 0749 116/77     Pulse Rate 09/05/17 0747 98     Resp 09/05/17 0747 16     Temp 09/05/17 0747 98 F (36.7 C)     Temp Source 09/05/17 0747 Oral     SpO2 09/05/17 0747 96 %     Weight 09/05/17 0748 185 lb (83.9 kg)     Height 09/05/17 0748 5\' 2"  (1.575 m)     Head Circumference --      Peak Flow --      Pain Score 09/05/17 0748 6     Pain Loc --      Pain Edu? --      Excl. in [place]? --     Constitutional: Alert and oriented. Well appearing and in no acute distress. Eyes: Conjunctivae are normal. EOMI. Ears: Bilateral TM injected. Nose: Sinus congestion noted; no rhinnorhea. Mouth/Throat: Mucous membranes are moist.  Oropharynx erythematous. Tonsils 1+. Neck: No stridor.  Lymphatic: Bilateral anterior cervical lymphadenopathy. Cardiovascular: Normal rate, regular rhythm. Good peripheral circulation. Respiratory: Normal respiratory effort.  No retractions. Breath sounds clear to auscultation. Gastrointestinal: Soft and nontender.  Musculoskeletal: FROM x 4 extremities.  Neurologic:  Normal speech and language.  Skin:  Skin is warm, dry and intact. No rash noted. Psychiatric: Mood and affect are normal. Speech and behavior are normal.  ____________________________________________   LABS (all labs ordered are listed, but only abnormal results are displayed)  Labs Reviewed  GROUP A STREP BY PCR   ____________________________________________  EKG  Not indicated. ____________________________________________  RADIOLOGY  Not indicated. ____________________________________________   PROCEDURES  Procedure(s) performed: None  Critical Care performed: No ____________________________________________   INITIAL IMPRESSION / ASSESSMENT AND PLAN / ED COURSE  47 y.o. female who presents to the emergency department for treatment and evaluations of symptoms and exam most consistent with a viral URI or influenza.  Because she states that her throat has become  increasingly sore over the past 24 hours, strep screen was ordered and is negative.  She is to the guaifenesin with codeine syrup as needed for body aches take, cough, and sore throat.  She is to follow-up with the primary care provider for choice in 2-3 days if not improving.  She was encouraged to return to the ER for symptoms that change or worsen if unable to schedule an appointment.  Medications - No data to display  ED Discharge Orders        Ordered    guaiFENesin-codeine 100-10 MG/5ML syrup  3 times daily PRN     09/05/17 5284       Pertinent labs & imaging results that were available during my care of the patient were reviewed by me and considered in my medical decision making (see chart for details).    If controlled substance prescribed during this visit, 12 month history viewed on the [place] prior to issuing an initial prescription for Schedule II or III opiod. ____________________________________________   FINAL CLINICAL IMPRESSION(S) / ED DIAGNOSES  Final diagnoses:  Influenza-like illness    Note:  This document was  prepared using Systems analyst and may include unintentional dictation errors.     Victorino Dike, FNP 09/05/17 1346    Earleen Newport, MD 09/05/17 1410

## 2017-09-05 NOTE — ED Triage Notes (Signed)
Pt reports that she began yesterday having cold sx's

## 2017-09-05 NOTE — ED Notes (Signed)
See triage note  States she developed sinus pressure,sore throat and chills yesterday  Subjective fever yesterday  Afebrile on arrival

## 2017-09-15 ENCOUNTER — Other Ambulatory Visit: Payer: Self-pay | Admitting: Counselor

## 2017-09-15 ENCOUNTER — Ambulatory Visit
Admission: RE | Admit: 2017-09-15 | Discharge: 2017-09-15 | Disposition: A | Discharge status: Home / Self Care | Payer: PRIVATE HEALTH INSURANCE | Source: Ambulatory Visit | Attending: Counselor | Admitting: Counselor

## 2017-09-15 ENCOUNTER — Ambulatory Visit
Admission: RE | Admit: 2017-09-15 | Discharge: 2017-09-15 | Disposition: A | Discharge status: Home / Self Care | Payer: Worker's Compensation | Source: Ambulatory Visit | Attending: Counselor | Admitting: Counselor

## 2017-09-15 DIAGNOSIS — S99921A Unspecified injury of right foot, initial encounter: Secondary | ICD-10-CM | POA: Insufficient documentation

## 2017-09-15 DIAGNOSIS — X58XXXA Exposure to other specified factors, initial encounter: Secondary | ICD-10-CM | POA: Diagnosis not present

## 2017-11-17 ENCOUNTER — Encounter: Payer: Self-pay | Admitting: Emergency Medicine

## 2017-11-17 ENCOUNTER — Other Ambulatory Visit: Payer: Self-pay

## 2017-11-17 ENCOUNTER — Emergency Department
Admission: EM | Admit: 2017-11-17 | Discharge: 2017-11-17 | Disposition: A | Discharge status: Home / Self Care | Payer: PRIVATE HEALTH INSURANCE | Attending: Emergency Medicine | Admitting: Emergency Medicine

## 2017-11-17 DIAGNOSIS — Z9049 Acquired absence of other specified parts of digestive tract: Secondary | ICD-10-CM | POA: Insufficient documentation

## 2017-11-17 DIAGNOSIS — J45909 Unspecified asthma, uncomplicated: Secondary | ICD-10-CM | POA: Diagnosis not present

## 2017-11-17 DIAGNOSIS — Z79899 Other long term (current) drug therapy: Secondary | ICD-10-CM | POA: Insufficient documentation

## 2017-11-17 DIAGNOSIS — J0100 Acute maxillary sinusitis, unspecified: Secondary | ICD-10-CM

## 2017-11-17 DIAGNOSIS — F17210 Nicotine dependence, cigarettes, uncomplicated: Secondary | ICD-10-CM | POA: Diagnosis not present

## 2017-11-17 DIAGNOSIS — Z8541 Personal history of malignant neoplasm of cervix uteri: Secondary | ICD-10-CM | POA: Diagnosis not present

## 2017-11-17 DIAGNOSIS — J329 Chronic sinusitis, unspecified: Secondary | ICD-10-CM | POA: Diagnosis present

## 2017-11-17 MED ORDER — CLARITHROMYCIN 500 MG PO TABS: 500.0000 mg | ORAL_TABLET | Freq: Two times a day (BID) | ORAL | 0 refills | Status: AC

## 2017-11-17 MED ORDER — FEXOFENADINE-PSEUDOEPHED ER 60-120 MG PO TB12: 1.0000 | ORAL_TABLET | Freq: Two times a day (BID) | ORAL | 0 refills | Status: DC

## 2017-11-17 NOTE — ED Notes (Signed)
Pt verbalized understanding of discharge instructions. NAD at this time. 

## 2017-11-17 NOTE — ED Provider Notes (Signed)
[facility] Emergency Department Provider Note   ____________________________________________   First MD Initiated Contact with Patient 11/17/17 1112     (approximate)  I have reviewed the triage vital signs and the nursing notes.   HISTORY  Chief Complaint Sinus Problem and Headache    HPI Sydney Wilson is a 47 y.o. female patient complained of frontal headache, sinus pressure, and earache for 1 week.  Patient rates the pain as 8/10.  Patient described the pain is "pressure".  Patient had no relief over-the-counter preparations.  Patient denies fever/chills, nausea, vomiting, no diarrhea.  Patient has history of migraine headaches.  Patient a headache she experiences not typical of her migraine presentations.  Patient has nasal sinus surgery in 2006.  Past Medical History:  Diagnosis Date  . Asthma   . Cancer ([place]) 1997   Uterine  . Migraines     Patient Active Problem List   Diagnosis Date Noted  . Fitting and adjustment of intestinal appliance and device   . Bile duct leak     Past Surgical History:  Procedure Laterality Date  . Carpel Tunnel release  2005  . CHOLECYSTECTOMY  07/30/2015  . CHOLECYSTECTOMY N/A 07/30/2015   Procedure: LAPAROSCOPIC CHOLECYSTECTOMY;  Surgeon: Jules Husbands, MD;  Location: [facility] ORS;  Service: General;  Laterality: N/A;  . ERCP N/A 08/06/2015   Procedure: ENDOSCOPIC RETROGRADE CHOLANGIOPANCREATOGRAPHY (ERCP);  Surgeon: Lucilla Lame, MD;  Location: [facility] ENDOSCOPY;  Service: Endoscopy;  Laterality: N/A;  . ERCP N/A 11/03/2015   Procedure: ENDOSCOPIC RETROGRADE CHOLANGIOPANCREATOGRAPHY (ERCP) stent removal;  Surgeon: Lucilla Lame, MD;  Location: [facility] ENDOSCOPY;  Service: Endoscopy;  Laterality: N/A;  . MINOR RELEASE DORSAL COMPARTMENT (DEQUERVAINS) Right 2005  . NASAL SINUS SURGERY  2006    Prior to Admission medications   Medication Sig Start Date End Date Taking? Authorizing Provider  clarithromycin (BIAXIN) 500 MG  tablet Take 1 tablet (500 mg total) by mouth 2 (two) times daily for 14 days. 11/17/17 12/01/17  Sable Feil, PA-C  dicyclomine (BENTYL) 10 MG capsule Take 1 capsule (10 mg total) by mouth 3 (three) times daily as needed for spasms. 11/08/16 11/22/16  Merlyn Lot, MD  fexofenadine-pseudoephedrine (ALLEGRA-D) 60-120 MG 12 hr tablet Take 1 tablet by mouth 2 (two) times daily. 11/17/17   Sable Feil, PA-C  guaiFENesin-codeine 100-10 MG/5ML syrup Take 10 mLs by mouth 3 (three) times daily as needed. 09/05/17   Triplett, Johnette Abraham B, FNP  levofloxacin (LEVAQUIN) 750 MG tablet Take 1 tablet (750 mg total) by mouth daily. Patient not taking: Reported on 11/03/2015 08/07/15   Hubbard Robinson, MD  metroNIDAZOLE (FLAGYL) 500 MG tablet Take 1 tablet (500 mg total) by mouth 3 (three) times daily. Patient not taking: Reported on 11/03/2015 08/07/15   Hubbard Robinson, MD  oxyCODONE-acetaminophen (PERCOCET/ROXICET) 5-325 MG tablet Take 1-2 tablets by mouth every 4 (four) hours as needed for moderate pain. 08/07/15   Hubbard Robinson, MD  pantoprazole (PROTONIX) 40 MG tablet Take 1 tablet (40 mg total) by mouth 2 (two) times daily. 08/07/15   Hubbard Robinson, MD  promethazine (PHENERGAN) 12.5 MG tablet Take 1 tablet (12.5 mg total) by mouth every 6 (six) hours as needed for nausea or vomiting. 11/08/16   Merlyn Lot, MD    Allergies Prednisone; Amoxicillin; Avelox [moxifloxacin hcl in nacl]; Penicillins; Sulfa antibiotics; and Vantin [cefpodoxime]  Family History  Problem Relation Age of Onset  . Cancer Mother  Liver  . Heart disease Father   . Diabetes Father   . Diabetes Sister   . Atrial fibrillation Sister   . Cancer Sister        Ovarian- x 3 sisters  . Diabetes Brother   . Heart disease Paternal Grandmother   . Diabetes Paternal Grandfather     Social History Social History   Tobacco Use  . Smoking status: Current Every Day Smoker    Packs/day: 0.25    Types: Cigarettes  .  Smokeless tobacco: Never Used  Substance Use Topics  . Alcohol use: No  . Drug use: No    Review of Systems Constitutional: No fever/chills Eyes: No visual changes. ENT: No sore throat.  Nasal congestion/pain. Cardiovascular: Denies chest pain. Respiratory: Denies shortness of breath. Gastrointestinal: No abdominal pain.  No nausea, no vomiting.  No diarrhea.  No constipation. Genitourinary: Negative for dysuria. Musculoskeletal: Negative for back pain. Skin: Negative for rash. Neurological: Negative for headaches, focal weakness or numbness. Allergic/Immunilogical: See medication list. ____________________________________________   PHYSICAL EXAM:  VITAL SIGNS: ED Triage Vitals  Enc Vitals Group     BP 11/17/17 1106 129/87     Pulse Rate 11/17/17 1106 80     Resp 11/17/17 1106 20     Temp 11/17/17 1106 98 F (36.7 C)     Temp Source 11/17/17 1106 Oral     SpO2 11/17/17 1106 97 %     Weight 11/17/17 1051 175 lb (79.4 kg)     Height 11/17/17 1051 5\' 2"  (1.575 m)     Head Circumference --      Peak Flow --      Pain Score 11/17/17 1051 8     Pain Loc --      Pain Edu? --      Excl. in [place]? --     Constitutional: Alert and oriented. Well appearing and in no acute distress. Eyes: Conjunctivae are normal. PERRL. EOMI.  Head: Atraumatic. Nose: Bilateral maxillary guarding.  Edematous nasal turbinates with thick rhinorrhea.   Mouth/Throat: Mucous membranes are moist.  Oropharynx non-erythematous. Neck: No stridor.   Cardiovascular: Normal rate, regular rhythm. Grossly normal heart sounds.  Good peripheral circulation. Respiratory: Normal respiratory effort.  No retractions. Lungs CTAB. Gastrointestinal: Soft and nontender. No distention. No abdominal bruits. No CVA tenderness. Musculoskeletal: No lower extremity tenderness nor edema.  No joint effusions. Neurologic:  Normal speech and language. No gross focal neurologic deficits are appreciated. No gait instability. Skin:   Skin is warm, dry and intact. No rash noted. Psychiatric: Mood and affect are normal. Speech and behavior are normal.  ____________________________________________   LABS (all labs ordered are listed, but only abnormal results are displayed)  Labs Reviewed - No data to display ____________________________________________  EKG   ____________________________________________  RADIOLOGY  ED MD interpretation:    Official radiology report(s): No results found.  ____________________________________________   PROCEDURES  Procedure(s) performed: None  Procedures  Critical Care performed: No  ____________________________________________   INITIAL IMPRESSION / ASSESSMENT AND PLAN / ED COURSE  As part of my medical decision making, I reviewed the following data within the electronic MEDICAL RECORD NUMBER    Sinus congestion facial pain secondary sinusitis.  Patient given discharge care instruction advised take medication as directed.  Patient advised to follow-up PCP for continued care.      ____________________________________________   FINAL CLINICAL IMPRESSION(S) / ED DIAGNOSES  Final diagnoses:  Subacute maxillary sinusitis     ED Discharge Orders  Ordered    fexofenadine-pseudoephedrine (ALLEGRA-D) 60-120 MG 12 hr tablet  2 times daily     11/17/17 1235    clarithromycin (BIAXIN) 500 MG tablet  2 times daily     11/17/17 1235       Note:  This document was prepared using Dragon voice recognition software and may include unintentional dictation errors.    Sable Feil, PA-C 11/17/17 1242    Lavonia Drafts, MD 11/17/17 1410

## 2017-11-17 NOTE — ED Triage Notes (Signed)
Says nausea--no vomiting.

## 2017-11-17 NOTE — ED Triage Notes (Signed)
Sinus pressure, nausea.

## 2017-11-17 NOTE — Discharge Instructions (Addendum)
Advised extra strength Tylenol for headache.

## 2017-11-18 NOTE — ED Provider Notes (Deleted)
[facility] Emergency Department Provider Note   ____________________________________________   First MD Initiated Contact with Patient 11/17/17 1112     (approximate)  I have reviewed the triage vital signs and the nursing notes.   HISTORY  Chief Complaint Sinus Problem and Headache    HPI Sydney Wilson is a 47 y.o. female patient presents with 1-1/2 weeks of sinus pressure, nausea without vomiting, and frontal headache.  Patient rates her pain discomfort is 8/10.  Patient described the pain is "achy/pressure".  No palliative measure over-the-counter medications.  Past Medical History:  Diagnosis Date  . Asthma   . Cancer ([place]) 1997   Uterine  . Migraines     Patient Active Problem List   Diagnosis Date Noted  . Fitting and adjustment of intestinal appliance and device   . Bile duct leak     Past Surgical History:  Procedure Laterality Date  . Carpel Tunnel release  2005  . CHOLECYSTECTOMY  07/30/2015  . CHOLECYSTECTOMY N/A 07/30/2015   Procedure: LAPAROSCOPIC CHOLECYSTECTOMY;  Surgeon: Jules Husbands, MD;  Location: [facility] ORS;  Service: General;  Laterality: N/A;  . ERCP N/A 08/06/2015   Procedure: ENDOSCOPIC RETROGRADE CHOLANGIOPANCREATOGRAPHY (ERCP);  Surgeon: Lucilla Lame, MD;  Location[facility] ENDOSCOPY;  Service: Endoscopy;  Laterality: N/A;  . ERCP N/A 11/03/2015   Procedure: ENDOSCOPIC RETROGRADE CHOLANGIOPANCREATOGRAPHY (ERCP) stent removal;  Surgeon: Lucilla Lame, MD;  Location: [facility] ENDOSCOPY;  Service: Endoscopy;  Laterality: N/A;  . MINOR RELEASE DORSAL COMPARTMENT (DEQUERVAINS) Right 2005  . NASAL SINUS SURGERY  2006    Prior to Admission medications   Medication Sig Start Date End Date Taking? Authorizing Provider  clarithromycin (BIAXIN) 500 MG tablet Take 1 tablet (500 mg total) by mouth 2 (two) times daily for 14 days. 11/17/17 12/01/17  Sable Feil, PA-C  dicyclomine (BENTYL) 10 MG capsule Take 1 capsule (10 mg total) by mouth  3 (three) times daily as needed for spasms. 11/08/16 11/22/16  Merlyn Lot, MD  fexofenadine-pseudoephedrine (ALLEGRA-D) 60-120 MG 12 hr tablet Take 1 tablet by mouth 2 (two) times daily. 11/17/17   Sable Feil, PA-C  guaiFENesin-codeine 100-10 MG/5ML syrup Take 10 mLs by mouth 3 (three) times daily as needed. 09/05/17   Triplett, Johnette Abraham B, FNP  levofloxacin (LEVAQUIN) 750 MG tablet Take 1 tablet (750 mg total) by mouth daily. Patient not taking: Reported on 11/03/2015 08/07/15   Hubbard Robinson, MD  metroNIDAZOLE (FLAGYL) 500 MG tablet Take 1 tablet (500 mg total) by mouth 3 (three) times daily. Patient not taking: Reported on 11/03/2015 08/07/15   Hubbard Robinson, MD  oxyCODONE-acetaminophen (PERCOCET/ROXICET) 5-325 MG tablet Take 1-2 tablets by mouth every 4 (four) hours as needed for moderate pain. 08/07/15   Hubbard Robinson, MD  pantoprazole (PROTONIX) 40 MG tablet Take 1 tablet (40 mg total) by mouth 2 (two) times daily. 08/07/15   Hubbard Robinson, MD  promethazine (PHENERGAN) 12.5 MG tablet Take 1 tablet (12.5 mg total) by mouth every 6 (six) hours as needed for nausea or vomiting. 11/08/16   Merlyn Lot, MD    Allergies Prednisone; Amoxicillin; Avelox [moxifloxacin hcl in nacl]; Penicillins; Sulfa antibiotics; and Vantin [cefpodoxime]  Family History  Problem Relation Age of Onset  . Cancer Mother        Liver  . Heart disease Father   . Diabetes Father   . Diabetes Sister   . Atrial fibrillation Sister   . Cancer Sister  Ovarian- x 3 sisters  . Diabetes Brother   . Heart disease Paternal Grandmother   . Diabetes Paternal Grandfather     Social History Social History   Tobacco Use  . Smoking status: Current Every Day Smoker    Packs/day: 0.25    Types: Cigarettes  . Smokeless tobacco: Never Used  Substance Use Topics  . Alcohol use: No  . Drug use: No    Review of Systems Constitutional: No fever/chills Eyes: No visual changes. ENT: No sore  throat.  Nasal and sinus congestion. Cardiovascular: Denies chest pain. Respiratory: Denies shortness of breath. Gastrointestinal: No abdominal pain.  Nausea without vomiting.  No diarrhea.  No constipation. Genitourinary: Negative for dysuria. Musculoskeletal: Negative for back pain. Skin: Negative for rash. Neurological: Negative for headaches, focal weakness or numbness. Allergic/Immunilogical: See medication list. ____________________________________________   PHYSICAL EXAM:  VITAL SIGNS: ED Triage Vitals  Enc Vitals Group     BP 11/17/17 1106 129/87     Pulse Rate 11/17/17 1106 80     Resp 11/17/17 1106 20     Temp 11/17/17 1106 98 F (36.7 C)     Temp Source 11/17/17 1106 Oral     SpO2 11/17/17 1106 97 %     Weight 11/17/17 1051 175 lb (79.4 kg)     Height 11/17/17 1051 5\' 2"  (1.575 m)     Head Circumference --      Peak Flow --      Pain Score 11/17/17 1051 8     Pain Loc --      Pain Edu? --      Excl. in [place]? --    Constitutional: Alert and oriented. Well appearing and in no acute distress. Eyes: Conjunctivae are normal. PERRL. EOMI. Head: Atraumatic. Nose: Bilateral frontal maxillary sinus guarding.  Edematous nasal turbinate thick rhinorrhea. Mouth/Throat: Mucous membranes are moist.  Oropharynx non-erythematous. Neck: No stridor. Hematological/Lymphatic/Immunilogical: No cervical lymphadenopathy. Cardiovascular: Normal rate, regular rhythm. Grossly normal heart sounds.  Good peripheral circulation. Respiratory: Normal respiratory effort.  No retractions. Lungs CTAB. Neurologic:  Normal speech and language. No gross focal neurologic deficits are appreciated. No gait instability. Skin:  Skin is warm, dry and intact. No rash noted. Psychiatric: Mood and affect are normal. Speech and behavior are normal.  ____________________________________________   LABS (all labs ordered are listed, but only abnormal results are displayed)  Labs Reviewed - No data to  display ____________________________________________  EKG   ____________________________________________  RADIOLOGY  ED MD interpretation:    Official radiology report(s): No results found.  ____________________________________________   PROCEDURES  Procedure(s) performed: None  Procedures  Critical Care performed: No  ____________________________________________   INITIAL IMPRESSION / ASSESSMENT AND PLAN / ED COURSE  As part of my medical decision making, I reviewed the following data within the electronic MEDICAL RECORD NUMBER    Maxillary sinusitis.  Patient given discharge care instruction.  Patient advised take medication as directed.  Patient advised follow-up PCP if no improvement in 3 to 5 days.      ____________________________________________   FINAL CLINICAL IMPRESSION(S) / ED DIAGNOSES  Final diagnoses:  Subacute maxillary sinusitis     ED Discharge Orders        Ordered    fexofenadine-pseudoephedrine (ALLEGRA-D) 60-120 MG 12 hr tablet  2 times daily     11/17/17 1235    clarithromycin (BIAXIN) 500 MG tablet  2 times daily     11/17/17 1235       Note:  This document  was prepared using Systems analyst and may include unintentional dictation errors.    Sable Feil, PA-C 11/18/17 1205

## 2018-02-22 ENCOUNTER — Encounter: Payer: Self-pay | Admitting: Emergency Medicine

## 2018-02-22 ENCOUNTER — Ambulatory Visit
Admission: EM | Admit: 2018-02-22 | Discharge: 2018-02-22 | Disposition: A | Discharge status: Home / Self Care | Payer: 59 | Attending: Family Medicine | Admitting: Family Medicine

## 2018-02-22 ENCOUNTER — Other Ambulatory Visit: Payer: Self-pay

## 2018-02-22 DIAGNOSIS — J0111 Acute recurrent frontal sinusitis: Secondary | ICD-10-CM | POA: Diagnosis not present

## 2018-02-22 DIAGNOSIS — J0100 Acute maxillary sinusitis, unspecified: Secondary | ICD-10-CM

## 2018-02-22 DIAGNOSIS — J0101 Acute recurrent maxillary sinusitis: Secondary | ICD-10-CM

## 2018-02-22 MED ORDER — DOXYCYCLINE HYCLATE 100 MG PO CAPS: 100.0000 mg | ORAL_CAPSULE | Freq: Two times a day (BID) | ORAL | 0 refills | Status: DC

## 2018-02-22 NOTE — ED Triage Notes (Signed)
Patient in today c/o nasal congestion, pressure, right ear pain and dry cough x 6 days. Patient has had a low grade fever of 99 and has had sweats and chills. Patient has tried OTC tension headache, ibuprofen and sinus medication without relief.

## 2018-02-22 NOTE — Discharge Instructions (Addendum)
Take medication as prescribed. Rest. Drink plenty of fluids.  ° °Follow up with your primary care physician this week as needed. Return to Urgent care for new or worsening concerns.  ° °

## 2018-02-22 NOTE — ED Provider Notes (Signed)
[facility] ____________________________________________  Time seen: Approximately 11:29 AM  I have reviewed the triage vital signs and the nursing notes.   HISTORY  Chief Complaint Nasal Congestion   HPI Sydney Wilson is a 47 y.o. female presenting for evaluation of 1 week of runny nose, nasal congestion, sinus pressure, sinus drainage.  Reports she has been getting nasal drainage out, reports now started to have postnasal drainage and leading to a scratchy throat.  States intermittent cough.  States sinus pressure is mostly to her bilateral forehead and cheeks.  States she does have a recurrent history of sinus infections with similar presentation.  Also reports has had sinus surgery as well as bilateral ear tube placement as an adult.  Denies known fevers.  Reports sick contacts recently at work.  Reports overall continues to eat and drink well.  Unresolved with over-the-counter cough and congestion sinus medications.  Reports otherwise doing well denies other complaints.  Denies other aggravating or alleviating factors. Denies recent sickness. Denies recent antibiotic use.    Past Medical History:  Diagnosis Date  . Asthma   . Cancer ([place]) 1997   Uterine  . Migraines     Patient Active Problem List   Diagnosis Date Noted  . Fitting and adjustment of intestinal appliance and device   . Bile duct leak     Past Surgical History:  Procedure Laterality Date  . Carpel Tunnel release  2005  . CHOLECYSTECTOMY  07/30/2015  . CHOLECYSTECTOMY N/A 07/30/2015   Procedure: LAPAROSCOPIC CHOLECYSTECTOMY;  Surgeon: Jules Husbands, MD;  Location: [facility] ORS;  Service: General;  Laterality: N/A;  . ERCP N/A 08/06/2015   Procedure: ENDOSCOPIC RETROGRADE CHOLANGIOPANCREATOGRAPHY (ERCP);  Surgeon: Lucilla Lame, MD;  Location: [facility] ENDOSCOPY;  Service: Endoscopy;  Laterality: N/A;  . ERCP N/A 11/03/2015   Procedure: ENDOSCOPIC RETROGRADE CHOLANGIOPANCREATOGRAPHY (ERCP) stent removal;   Surgeon: Lucilla Lame, MD;  Location: [facility] ENDOSCOPY;  Service: Endoscopy;  Laterality: N/A;  . MINOR RELEASE DORSAL COMPARTMENT (DEQUERVAINS) Right 2005  . NASAL SINUS SURGERY  2006     No current facility-administered medications for this encounter.   Current Outpatient Medications:  .  doxycycline (VIBRAMYCIN) 100 MG capsule, Take 1 capsule (100 mg total) by mouth 2 (two) times daily., Disp: 20 capsule, Rfl: 0  Allergies Prednisone; Amoxicillin; Avelox [moxifloxacin hcl in nacl]; Penicillins; Sulfa antibiotics; and Vantin [cefpodoxime]  Family History  Problem Relation Age of Onset  . Cancer Mother        Liver  . Heart disease Father   . Diabetes Father   . Diabetes Sister   . Atrial fibrillation Sister   . Cancer Sister        Ovarian- x 3 sisters  . Diabetes Brother   . Heart disease Paternal Grandmother   . Diabetes Paternal Grandfather     Social History Social History   Tobacco Use  . Smoking status: Current Every Day Smoker    Packs/day: 0.50    Types: Cigarettes  . Smokeless tobacco: Never Used  Substance Use Topics  . Alcohol use: No  . Drug use: No    Review of Systems Constitutional: No fever/chills ENT: as above.  Cardiovascular: Denies chest pain. Respiratory: Denies shortness of breath. Gastrointestinal: No abdominal pain.   Musculoskeletal: Negative for back pain. Skin: Negative for rash.   ____________________________________________   PHYSICAL EXAM:  VITAL SIGNS: ED Triage Vitals  Enc Vitals Group     BP 02/22/18 1108 (!) 116/97     Pulse  Rate 02/22/18 1108 85     Resp 02/22/18 1108 16     Temp 02/22/18 1108 98 F (36.7 C)     Temp Source 02/22/18 1108 Oral     SpO2 02/22/18 1108 99 %     Weight 02/22/18 1107 180 lb (81.6 kg)     Height 02/22/18 1107 5\' 2"  (1.575 m)     Head Circumference --      Peak Flow --      Pain Score 02/22/18 1107 7     Pain Loc --      Pain Edu? --      Excl. in [place]? --     Constitutional: Alert  and oriented. Well appearing and in no acute distress. Eyes: Conjunctivae are normal.  Head: Atraumatic.Mild to moderate tenderness to palpation bilateral frontal and maxillary sinuses. No swelling. No erythema.   Ears: no erythema, normal TMs bilaterally.   Nose: nasal congestion with bilateral nasal turbinate erythema and edema.   Mouth/Throat: Mucous membranes are moist.  Oropharynx non-erythematous.No tonsillar swelling or exudate.  Neck: No stridor.  No cervical spine tenderness to palpation. Hematological/Lymphatic/Immunilogical: No cervical lymphadenopathy. Cardiovascular: Normal rate, regular rhythm. Grossly normal heart sounds.  Good peripheral circulation. Respiratory: Normal respiratory effort.  No retractions.  No wheezes, rales or rhonchi. Good air movement.  Musculoskeletal: Steady gait. Neurologic:  Normal speech and language.  No gait instability. Skin:  Skin is warm, dry and intact. No rash noted. Psychiatric: Mood and affect are normal. Speech and behavior are normal.  ___________________________________________   LABS (all labs ordered are listed, but only abnormal results are displayed)  Labs Reviewed - No data to display   PROCEDURES Procedures    INITIAL IMPRESSION / ASSESSMENT AND PLAN / ED COURSE  Pertinent labs & imaging results that were available during my care of the patient were reviewed by me and considered in my medical decision making (see chart for details).  Well-appearing patient.  No acute distress.  Past medical history of recurrent sinusitis, exam consistent with similar.  Will treat with oral doxycycline.  Continue home allergy medication.  Encourage rest, fluids, supportive care.Discussed indication, risks and benefits of medications with patient.  Discussed follow up with Primary care physician this week. Discussed follow up and return parameters including no resolution or any worsening concerns. Patient verbalized understanding and agreed to  plan.   ____________________________________________   FINAL CLINICAL IMPRESSION(S) / ED DIAGNOSES  Final diagnoses:  Acute recurrent frontal sinusitis  Acute recurrent maxillary sinusitis     ED Discharge Orders         Ordered    doxycycline (VIBRAMYCIN) 100 MG capsule  2 times daily     02/22/18 1121           Note: This dictation was prepared with Dragon dictation along with smaller phrase technology. Any transcriptional errors that result from this process are unintentional.         Marylene Land, NP 02/22/18 1133

## 2018-03-26 ENCOUNTER — Other Ambulatory Visit: Payer: Self-pay

## 2018-03-26 ENCOUNTER — Ambulatory Visit
Admission: EM | Admit: 2018-03-26 | Discharge: 2018-03-26 | Disposition: A | Discharge status: Home / Self Care | Payer: 59 | Attending: Physician Assistant | Admitting: Physician Assistant

## 2018-03-26 DIAGNOSIS — J0101 Acute recurrent maxillary sinusitis: Secondary | ICD-10-CM

## 2018-03-26 DIAGNOSIS — M5442 Lumbago with sciatica, left side: Secondary | ICD-10-CM | POA: Diagnosis not present

## 2018-03-26 MED ORDER — TRAMADOL HCL 50 MG PO TABS: 50.0000 mg | ORAL_TABLET | Freq: Four times a day (QID) | ORAL | 0 refills | Status: AC | PRN

## 2018-03-26 MED ORDER — CEFDINIR 300 MG PO CAPS: 300.0000 mg | ORAL_CAPSULE | Freq: Two times a day (BID) | ORAL | 0 refills | Status: AC

## 2018-03-26 MED ORDER — CYCLOBENZAPRINE HCL 10 MG PO TABS: 10.0000 mg | ORAL_TABLET | Freq: Two times a day (BID) | ORAL | 0 refills | Status: AC | PRN

## 2018-03-26 MED ORDER — MELOXICAM 7.5 MG PO TABS: 7.5000 mg | ORAL_TABLET | Freq: Every day | ORAL | 0 refills | Status: AC

## 2018-03-26 NOTE — Discharge Instructions (Signed)
BACK PAIN: Stressed avoiding painful activities . PRICE guidelines reviewed. May alternate ice and heat. Consider use of muscle rubs, Salonpas patches, etc. Use medications as directed including muscle relaxers. Take anti-inflammatory medications as prescribed. F/u with PCP in 7-10 days for reexamination, and please feel free to call or return to the urgent care at any time for any questions or concerns you may have and we will be happy to help you!   BACK PAIN RED FLAGS: If the back pain acutely worsens or there are any red flag symptoms such as numbness/tingling, leg weakness, saddle anesthesia, or loss of bowel/bladder control, go immediately to the ER. Follow up with [place] as scheduled or sooner if the pain does not begin to resolve or if it worsens before the follow up    SINUSITIS: Reviewed use of a nasal saline irrigation system. May consider use of intranasal steroids, such as Flonase as well. Use medications as directed. If antibiotics are prescribed, take the full course of antibiotics. Also consider use of Sudafed or Mucinex D. Increase rest, fluids. If you do not improve or if you worsen after a course of antibiotics, you should be re-examined. You may need a different antibiotic or further evaluation with imaging or an exam of the inside of the sinuses

## 2018-03-26 NOTE — ED Triage Notes (Signed)
Patient complains of low back pain that radiates down both legs that started yesterday.  Patient also complains of sinus pain and pressure, cough with congestion, unable to sleep due to cough x 1 month. Patient states that she was seen here 1 month ago and was given doxycycline. States that she improved some and has since worsened again.

## 2018-03-26 NOTE — ED Provider Notes (Signed)
[facility]    CSN: [id number] Arrival date & time: 03/26/18  1011     History   Chief Complaint Chief Complaint  Patient presents with  . Back Pain  . Sinusitis    HPI Sydney Wilson is a 47 y.o. female. Patient presents with multiple complaints today. She states that she has had sinus pressure/pain, nasal congestion and cough x 5 days. Admits to sinus surgery in the past and frequent sinus infections. She was seen in the office and treated with doxycycline 1 month ago. She says symptoms cleared up until 5 days ago. She has taken OTC mucinex without relief.   Patient also complaining of sudden onset of bilateral lower back pain with radiation to lower extremities this morning. Denies injury. Denies weakness, numbness/tingling. Denies bowel/bladder incontinence. Patient denies history of back problems. She has taken Motrin OTC without relief.  HPI  Past Medical History:  Diagnosis Date  . Asthma   . Cancer ([place]) 1997   Uterine  . Migraines     Patient Active Problem List   Diagnosis Date Noted  . Fitting and adjustment of intestinal appliance and device   . Bile duct leak     Past Surgical History:  Procedure Laterality Date  . Carpel Tunnel release  2005  . CHOLECYSTECTOMY  07/30/2015  . CHOLECYSTECTOMY N/A 07/30/2015   Procedure: LAPAROSCOPIC CHOLECYSTECTOMY;  Surgeon: Jules Husbands, MD;  Location: [facility] ORS;  Service: General;  Laterality: N/A;  . ERCP N/A 08/06/2015   Procedure: ENDOSCOPIC RETROGRADE CHOLANGIOPANCREATOGRAPHY (ERCP);  Surgeon: Lucilla Lame, MD;  Location: [facility] ENDOSCOPY;  Service: Endoscopy;  Laterality: N/A;  . ERCP N/A 11/03/2015   Procedure: ENDOSCOPIC RETROGRADE CHOLANGIOPANCREATOGRAPHY (ERCP) stent removal;  Surgeon: Lucilla Lame, MD;  Location: [facility] ENDOSCOPY;  Service: Endoscopy;  Laterality: N/A;  . MINOR RELEASE DORSAL COMPARTMENT (DEQUERVAINS) Right 2005  . NASAL SINUS SURGERY  2006    OB History   None      Home Medications     Prior to Admission medications   Medication Sig Start Date End Date Taking? Authorizing Provider  cefdinir (OMNICEF) 300 MG capsule Take 1 capsule (300 mg total) by mouth 2 (two) times daily for 7 days. 03/26/18 04/02/18  Laurene Footman B, PA-C  cyclobenzaprine (FLEXERIL) 10 MG tablet Take 1 tablet (10 mg total) by mouth 2 (two) times daily as needed for up to 7 days for muscle spasms. 03/26/18 04/02/18  Laurene Footman B, PA-C  doxycycline (VIBRAMYCIN) 100 MG capsule Take 1 capsule (100 mg total) by mouth 2 (two) times daily. 02/22/18   Marylene Land, NP  meloxicam (MOBIC) 7.5 MG tablet Take 1 tablet (7.5 mg total) by mouth daily for 10 days. 03/26/18 04/05/18  Danton Clap, PA-C  traMADol (ULTRAM) 50 MG tablet Take 1 tablet (50 mg total) by mouth every 6 (six) hours as needed for up to 3 days. 03/26/18 03/29/18  Danton Clap, PA-C    Family History Family History  Problem Relation Age of Onset  . Cancer Mother        Liver  . Heart disease Father   . Diabetes Father   . Diabetes Sister   . Atrial fibrillation Sister   . Cancer Sister        Ovarian- x 3 sisters  . Diabetes Brother   . Heart disease Paternal Grandmother   . Diabetes Paternal Grandfather     Social History Social History   Tobacco Use  . Smoking status: Current Every  Day Smoker    Packs/day: 0.50    Types: Cigarettes  . Smokeless tobacco: Never Used  Substance Use Topics  . Alcohol use: No  . Drug use: No     Allergies   Prednisone; Amoxicillin; Avelox [moxifloxacin hcl in nacl]; Penicillins; Sulfa antibiotics; and Vantin [cefpodoxime]   Review of Systems Review of Systems  Constitutional: Negative for activity change, appetite change, fatigue and fever.  HENT: Positive for congestion, postnasal drip, rhinorrhea, sinus pressure, sinus pain and sore throat. Negative for ear pain.   Eyes: Negative for pain and redness.  Respiratory: Positive for cough. Negative for shortness of breath.     Gastrointestinal: Negative for abdominal pain, nausea and vomiting.  Genitourinary: Negative for dysuria, flank pain and frequency.  Musculoskeletal: Positive for arthralgias, back pain and myalgias. Negative for gait problem and joint swelling.  Neurological: Positive for headaches. Negative for dizziness, weakness and numbness.     Physical Exam Triage Vital Signs ED Triage Vitals  Enc Vitals Group     BP 03/26/18 1028 125/82     Pulse Rate 03/26/18 1028 82     Resp 03/26/18 1028 18     Temp 03/26/18 1028 98.2 F (36.8 C)     Temp Source 03/26/18 1028 Oral     SpO2 03/26/18 1028 97 %     Weight 03/26/18 1028 180 lb (81.6 kg)     Height 03/26/18 1028 5\' 2"  (1.575 m)     Head Circumference --      Peak Flow --      Pain Score 03/26/18 1027 8     Pain Loc --      Pain Edu? --      Excl. in [place]? --    No data found.  Updated Vital Signs BP 125/82 (BP Location: Left Arm)   Pulse 82   Temp 98.2 F (36.8 C) (Oral)   Resp 18   Ht 5\' 2"  (1.575 m)   Wt 180 lb (81.6 kg)   LMP 08/11/2016   SpO2 97%   BMI 32.92 kg/m       Physical Exam  Constitutional: She is oriented to person, place, and time. She appears well-developed and well-nourished. No distress.  HENT:  Head: Normocephalic and atraumatic.  Right Ear: External ear normal.  Left Ear: External ear normal.  Mouth/Throat: Oropharynx is clear and moist.  TTP maxillary sinuses bilaterally  Eyes: Pupils are equal, round, and reactive to light. Conjunctivae and EOM are normal. Right eye exhibits no discharge. Left eye exhibits no discharge.  Neck: Normal range of motion. Neck supple.  Cardiovascular: Normal rate, regular rhythm and normal heart sounds.  No murmur heard. Pulmonary/Chest: Effort normal and breath sounds normal. No respiratory distress.  Musculoskeletal: Normal range of motion.  BACK: TTP of lumbar spinous processes and bilateral lumbar paravertebral muscles. Neg SLR bilal. 2+ patellar reflexes, 5/5  strength bilat LEs, normal gait  Neurological: She is alert and oriented to person, place, and time. She displays normal reflexes.  Skin: Skin is warm and dry. She is not diaphoretic.  Psychiatric: She has a normal mood and affect. Her behavior is normal.  Nursing note and vitals reviewed.    UC Treatments / Results  Labs (all labs ordered are listed, but only abnormal results are displayed) Labs Reviewed - No data to display  EKG None  Radiology No results found.  Procedures Procedures (including critical care time)  Medications Ordered in UC Medications - No data to display  Initial Impression / Assessment and Plan / UC Course  I have reviewed the triage vital signs and the nursing notes.  Pertinent labs & imaging results that were available during my care of the patient were reviewed by me and considered in my medical decision making (see chart for details).   Patient with recurrent sinus infections. She took doxycycline last month. She has a long list of allergies, including many antibiotics. Discussed beginning [place]. Patient agreeable. Discussed to notify us and/or go to ER for acute allergy symptoms. Discussed further care of sinus infection.  Patient also has back pain with unilateral sciatica. She lists allergy to prednisone. Will begin patient on meloxicam, cyclobenzaprine and Ultram only as needed for additional pain relief. Advised to f/u with PCP about this within the next week. Also discussed when to go to ER or return to the clinic for re-examination.   Final Clinical Impressions(s) / UC Diagnoses   Final diagnoses:  Acute bilateral low back pain with left-sided sciatica  Acute recurrent maxillary sinusitis     Discharge Instructions     BACK PAIN: Stressed avoiding painful activities . PRICE guidelines reviewed. May alternate ice and heat. Consider use of muscle rubs, Salonpas patches, etc. Use medications as directed including muscle relaxers. Take  anti-inflammatory medications as prescribed. F/u with PCP in 7-10 days for reexamination, and please feel free to call or return to the urgent care at any time for any questions or concerns you may have and we will be happy to help you!   BACK PAIN RED FLAGS: If the back pain acutely worsens or there are any red flag symptoms such as numbness/tingling, leg weakness, saddle anesthesia, or loss of bowel/bladder control, go immediately to the ER. Follow up with [place] as scheduled or sooner if the pain does not begin to resolve or if it worsens before the follow up    SINUSITIS: Reviewed use of a nasal saline irrigation system. May consider use of intranasal steroids, such as Flonase as well. Use medications as directed. If antibiotics are prescribed, take the full course of antibiotics. Also consider use of Sudafed or Mucinex D. Increase rest, fluids. If you do not improve or if you worsen after a course of antibiotics, you should be re-examined. You may need a different antibiotic or further evaluation with imaging or an exam of the inside of the sinuses     ED Prescriptions    Medication Sig Dispense Auth. Provider   cefdinir (OMNICEF) 300 MG capsule Take 1 capsule (300 mg total) by mouth 2 (two) times daily for 7 days. 14 capsule Laurene Footman B, PA-C   cyclobenzaprine (FLEXERIL) 10 MG tablet Take 1 tablet (10 mg total) by mouth 2 (two) times daily as needed for up to 7 days for muscle spasms. 20 tablet Laurene Footman B, PA-C   meloxicam (MOBIC) 7.5 MG tablet Take 1 tablet (7.5 mg total) by mouth daily for 10 days. 10 tablet Laurene Footman B, PA-C   traMADol (ULTRAM) 50 MG tablet Take 1 tablet (50 mg total) by mouth every 6 (six) hours as needed for up to 3 days. 15 tablet Danton Clap, PA-C     Controlled Substance Prescriptions [place] Controlled Substance Registry consulted? No. YES. This was reviewed.   Danton Clap, PA-C 03/28/18 1734

## 2018-04-05 DIAGNOSIS — Z23 Encounter for immunization: Secondary | ICD-10-CM | POA: Diagnosis not present

## 2018-04-27 ENCOUNTER — Emergency Department
Admission: EM | Admit: 2018-04-27 | Discharge: 2018-04-27 | Disposition: A | Discharge status: Home / Self Care | Payer: 59 | Attending: Emergency Medicine | Admitting: Emergency Medicine

## 2018-04-27 ENCOUNTER — Encounter: Payer: Self-pay | Admitting: Emergency Medicine

## 2018-04-27 ENCOUNTER — Other Ambulatory Visit: Payer: Self-pay

## 2018-04-27 ENCOUNTER — Emergency Department: Payer: 59

## 2018-04-27 DIAGNOSIS — J45909 Unspecified asthma, uncomplicated: Secondary | ICD-10-CM | POA: Insufficient documentation

## 2018-04-27 DIAGNOSIS — J069 Acute upper respiratory infection, unspecified: Secondary | ICD-10-CM | POA: Diagnosis not present

## 2018-04-27 DIAGNOSIS — R05 Cough: Secondary | ICD-10-CM | POA: Diagnosis not present

## 2018-04-27 DIAGNOSIS — R197 Diarrhea, unspecified: Secondary | ICD-10-CM | POA: Insufficient documentation

## 2018-04-27 DIAGNOSIS — R112 Nausea with vomiting, unspecified: Secondary | ICD-10-CM | POA: Diagnosis not present

## 2018-04-27 DIAGNOSIS — F17210 Nicotine dependence, cigarettes, uncomplicated: Secondary | ICD-10-CM | POA: Diagnosis not present

## 2018-04-27 DIAGNOSIS — B9789 Other viral agents as the cause of diseases classified elsewhere: Secondary | ICD-10-CM | POA: Diagnosis not present

## 2018-04-27 MED ORDER — ONDANSETRON HCL 4 MG/2ML IJ SOLN
4.0000 mg | Freq: Once | INTRAMUSCULAR | Status: AC
  Administered 2018-04-27: 4 mg via INTRAVENOUS
  Filled 2018-04-27: qty 2

## 2018-04-27 MED ORDER — IPRATROPIUM-ALBUTEROL 0.5-2.5 (3) MG/3ML IN SOLN
3.0000 mL | Freq: Once | RESPIRATORY_TRACT | Status: AC
  Administered 2018-04-27: 3 mL via RESPIRATORY_TRACT
  Filled 2018-04-27: qty 3

## 2018-04-27 NOTE — ED Provider Notes (Signed)
[facility] Emergency Department Provider Note   ____________________________________________    I have reviewed the triage vital signs and the nursing notes.   HISTORY  Chief Complaint Emesis and Cough     HPI Sydney Wilson is a 47 y.o. female who presents with complaints of cough as well as nausea vomiting diarrhea.  Patient reports over the last 3 days she has had worsening cough, nasal congestion.  Denies fevers.  This morning she woke up and felt nauseated and had 2 episodes of vomiting and an episode of diarrhea.  She reports she feels better now.  Denies sick contacts.  Does smoke cigarettes.  Past Medical History:  Diagnosis Date  . Asthma   . Cancer ([place]) 1997   Uterine  . Migraines     Patient Active Problem List   Diagnosis Date Noted  . Fitting and adjustment of intestinal appliance and device   . Bile duct leak     Past Surgical History:  Procedure Laterality Date  . Carpel Tunnel release  2005  . CHOLECYSTECTOMY  07/30/2015  . CHOLECYSTECTOMY N/A 07/30/2015   Procedure: LAPAROSCOPIC CHOLECYSTECTOMY;  Surgeon: Jules Husbands, MD;  Location: [facility] ORS;  Service: General;  Laterality: N/A;  . ERCP N/A 08/06/2015   Procedure: ENDOSCOPIC RETROGRADE CHOLANGIOPANCREATOGRAPHY (ERCP);  Surgeon: Lucilla Lame, MD;  Location: [facility] ENDOSCOPY;  Service: Endoscopy;  Laterality: N/A;  . ERCP N/A 11/03/2015   Procedure: ENDOSCOPIC RETROGRADE CHOLANGIOPANCREATOGRAPHY (ERCP) stent removal;  Surgeon: Lucilla Lame, MD;  Location: [facility] ENDOSCOPY;  Service: Endoscopy;  Laterality: N/A;  . MINOR RELEASE DORSAL COMPARTMENT (DEQUERVAINS) Right 2005  . NASAL SINUS SURGERY  2006    Prior to Admission medications   Medication Sig Start Date End Date Taking? Authorizing Provider  doxycycline (VIBRAMYCIN) 100 MG capsule Take 1 capsule (100 mg total) by mouth 2 (two) times daily. Patient not taking: Reported on 04/27/2018 02/22/18   Marylene Land, NP      Allergies Prednisone; Amoxicillin; Avelox [moxifloxacin hcl in nacl]; Penicillins; Sulfa antibiotics; and Vantin [cefpodoxime]  Family History  Problem Relation Age of Onset  . Cancer Mother        Liver  . Heart disease Father   . Diabetes Father   . Diabetes Sister   . Atrial fibrillation Sister   . Cancer Sister        Ovarian- x 3 sisters  . Diabetes Brother   . Heart disease Paternal Grandmother   . Diabetes Paternal Grandfather     Social History Social History   Tobacco Use  . Smoking status: Current Every Day Smoker    Packs/day: 0.50    Types: Cigarettes  . Smokeless tobacco: Never Used  Substance Use Topics  . Alcohol use: No  . Drug use: No    Review of Systems  Constitutional: No fever/chills Eyes: No visual changes.  ENT: Congestion Cardiovascular: Denies chest pain. Respiratory: Denies shortness of breath.  Cough as above Gastrointestinal: As above Genitourinary: Negative for dysuria. Musculoskeletal: Negative for back pain. Skin: Negative for rash. Neurological: Negative for headaches    ____________________________________________   PHYSICAL EXAM:  VITAL SIGNS: ED Triage Vitals  Enc Vitals Group     BP 04/27/18 0557 121/87     Pulse Rate 04/27/18 0557 87     Resp 04/27/18 0557 20     Temp 04/27/18 0557 (!) 97.5 F (36.4 C)     Temp Source 04/27/18 0557 Oral     SpO2 04/27/18 0557 98 %  Weight 04/27/18 0552 83.9 kg (185 lb)     Height 04/27/18 0552 1.575 m (5\' 2" )     Head Circumference --      Peak Flow --      Pain Score 04/27/18 0552 2     Pain Loc --      Pain Edu? --      Excl. in [place]? --     Constitutional: Alert and oriented.  Eyes: Conjunctivae are normal.    Mouth/Throat: Mucous membranes are moist.    Cardiovascular: Normal rate, regular rhythm. Grossly normal heart sounds.  Good peripheral circulation. Respiratory: Normal respiratory effort.  No retractions.  Scattered wheezes Gastrointestinal: Soft and  nontender. No distention.    Musculoskeletal: No lower extremity tenderness nor edema.  Warm and well perfused Neurologic:  Normal speech and language. No gross focal neurologic deficits are appreciated.  Skin:  Skin is warm, dry and intact. No rash noted. Psychiatric: Mood and affect are normal. Speech and behavior are normal.  ____________________________________________   LABS (all labs ordered are listed, but only abnormal results are displayed)  Labs Reviewed - No data to display ____________________________________________  EKG  None ____________________________________________  RADIOLOGY  None ____________________________________________   PROCEDURES  Procedure(s) performed: No  Procedures   Critical Care performed: No ____________________________________________   INITIAL IMPRESSION / ASSESSMENT AND PLAN / ED COURSE  Pertinent labs & imaging results that were available during my care of the patient were reviewed by me and considered in my medical decision making (see chart for details).  Patient well-appearing in no acute distress, symptoms most consistent with viral illness, upper respiratory.  Also possibly symptoms of gastroenteritis.  Will treat with IV Zofran, check a chest x-ray, give DuoNeb and reevaluate   ----------------------------------------- 8:29 AM on 04/27/2018 -----------------------------------------  Patient feels significantly improved, on reexam no further wheezing.  No further nausea.  Appropriate for discharge at this time with outpatient follow-up.  Return precautions discussed    ____________________________________________   FINAL CLINICAL IMPRESSION(S) / ED DIAGNOSES  Final diagnoses:  Viral upper respiratory tract infection        Note:  This document was prepared using Dragon voice recognition software and may include unintentional dictation errors.    Lavonia Drafts, MD 04/27/18 [id number]

## 2018-04-27 NOTE — ED Notes (Signed)
[place] sent to lab following iv start , pt asked to submit an UA

## 2018-04-27 NOTE — ED Triage Notes (Signed)
Patient to ER for c/o N/V/D (4 emesis episodes just this AM, 2 diarrhea episodes this am). Also reports large amounts of yellow drainage/sputum from nose and with coughing. Patient states after coughing, she now also has sore throat. Patient reports she believes she has had fever, but unsure of temp.

## 2018-06-13 ENCOUNTER — Ambulatory Visit
Admission: EM | Admit: 2018-06-13 | Discharge: 2018-06-13 | Disposition: A | Discharge status: Home / Self Care | Payer: 59 | Attending: Family Medicine | Admitting: Family Medicine

## 2018-06-13 ENCOUNTER — Other Ambulatory Visit: Payer: Self-pay

## 2018-06-13 ENCOUNTER — Encounter: Payer: Self-pay | Admitting: Emergency Medicine

## 2018-06-13 DIAGNOSIS — J329 Chronic sinusitis, unspecified: Secondary | ICD-10-CM

## 2018-06-13 DIAGNOSIS — F17210 Nicotine dependence, cigarettes, uncomplicated: Secondary | ICD-10-CM

## 2018-06-13 DIAGNOSIS — J069 Acute upper respiratory infection, unspecified: Secondary | ICD-10-CM | POA: Insufficient documentation

## 2018-06-13 DIAGNOSIS — B9789 Other viral agents as the cause of diseases classified elsewhere: Secondary | ICD-10-CM | POA: Diagnosis not present

## 2018-06-13 MED ORDER — IPRATROPIUM BROMIDE 0.06 % NA SOLN: 2.0000 | Freq: Four times a day (QID) | NASAL | 0 refills | Status: DC | PRN

## 2018-06-13 MED ORDER — DOXYCYCLINE HYCLATE 100 MG PO CAPS: 100.0000 mg | ORAL_CAPSULE | Freq: Two times a day (BID) | ORAL | 0 refills | Status: DC

## 2018-06-13 MED ORDER — CETIRIZINE-PSEUDOEPHEDRINE ER 5-120 MG PO TB12: 1.0000 | ORAL_TABLET | Freq: Two times a day (BID) | ORAL | 0 refills | Status: DC

## 2018-06-13 NOTE — ED Triage Notes (Signed)
Patient in today c/o sinus pressure/pain, headache and cough x 2 days. Patient denies fever, but has had some chills. Patient has tried Robitussin, Excedrin and Ibuprofen.

## 2018-06-13 NOTE — Discharge Instructions (Signed)
Medications as prescribed.  If symptoms do not improve, you can start the antibiotic. Does not appear to be bacterial at this time.  Take care  Dr. Lacinda Axon

## 2018-06-13 NOTE — ED Provider Notes (Signed)
[facility]    CSN: [id number] Arrival date & time: 06/13/18  1008  History   Chief Complaint Chief Complaint  Patient presents with  . Facial Pain  . Headache  . Cough   HPI  47 year old female presents with the above complaints.  Patient states her symptoms started on Sunday.  She reports headache, sinus pain and pressure (maxillary region), cough, and sneezing.  No fever.  She states intermittent production of yellow nasal mucus.  Taking Robitussin, Excedrin, and ibuprofen without resolution.  No known exacerbating factors.  Has a history of chronic sinusitis.  Previous sinus surgery.  Has not seen ENT ears.  No other associated symptoms.  No other complaints.  PMH, Surgical Hx, Family Hx, Social History reviewed and updated as below.  Past Medical History:  Diagnosis Date  . Asthma   . Cancer ([place]) 1997   Uterine  . Migraines   Recurrent sinusitis   Patient Active Problem List   Diagnosis Date Noted  . Fitting and adjustment of intestinal appliance and device   . Bile duct leak    Past Surgical History:  Procedure Laterality Date  . Carpel Tunnel release  2005  . CHOLECYSTECTOMY  07/30/2015  . CHOLECYSTECTOMY N/A 07/30/2015   Procedure: LAPAROSCOPIC CHOLECYSTECTOMY;  Surgeon: Jules Husbands, MD;  Location: [facility] ORS;  Service: General;  Laterality: N/A;  . ERCP N/A 08/06/2015   Procedure: ENDOSCOPIC RETROGRADE CHOLANGIOPANCREATOGRAPHY (ERCP);  Surgeon: Lucilla Lame, MD;  Location: [facility] ENDOSCOPY;  Service: Endoscopy;  Laterality: N/A;  . ERCP N/A 11/03/2015   Procedure: ENDOSCOPIC RETROGRADE CHOLANGIOPANCREATOGRAPHY (ERCP) stent removal;  Surgeon: Lucilla Lame, MD;  Location: [facility] ENDOSCOPY;  Service: Endoscopy;  Laterality: N/A;  . MINOR RELEASE DORSAL COMPARTMENT (DEQUERVAINS) Right 2005  . NASAL SINUS SURGERY  2006    OB History   None      Home Medications    Prior to Admission medications   Medication Sig Start Date End Date Taking? Authorizing  Provider  cetirizine-pseudoephedrine (ZYRTEC-D) 5-120 MG tablet Take 1 tablet by mouth 2 (two) times daily. 06/13/18   Coral Spikes, DO  doxycycline (VIBRAMYCIN) 100 MG capsule Take 1 capsule (100 mg total) by mouth 2 (two) times daily. 06/13/18   Thersa Salt G, DO  ipratropium (ATROVENT) 0.06 % nasal spray Place 2 sprays into both nostrils 4 (four) times daily as needed for rhinitis. 06/13/18   Coral Spikes, DO    Family History Family History  Problem Relation Age of Onset  . Cancer Mother        Liver  . Heart disease Father   . Diabetes Father   . Diabetes Sister   . Atrial fibrillation Sister   . Cancer Sister        Ovarian- x 3 sisters  . Diabetes Brother   . Heart disease Paternal Grandmother   . Diabetes Paternal Grandfather     Social History Social History   Tobacco Use  . Smoking status: Current Every Day Smoker    Packs/day: 0.50    Types: Cigarettes  . Smokeless tobacco: Never Used  Substance Use Topics  . Alcohol use: No  . Drug use: No     Allergies   Prednisone; Amoxicillin; Avelox [moxifloxacin hcl in nacl]; Penicillins; Sulfa antibiotics; and Vantin [cefpodoxime]   Review of Systems Review of Systems  Constitutional: Negative for fever.  HENT: Positive for sinus pressure, sinus pain and sneezing.   Respiratory: Positive for cough.   Neurological: Positive for headaches.  Physical Exam Triage Vital Signs ED Triage Vitals  Enc Vitals Group     BP 06/13/18 1021 124/76     Pulse Rate 06/13/18 1021 79     Resp 06/13/18 1021 16     Temp 06/13/18 1021 97.8 F (36.6 C)     Temp Source 06/13/18 1021 Oral     SpO2 06/13/18 1021 99 %     Weight 06/13/18 1022 190 lb (86.2 kg)     Height 06/13/18 1022 5\' 2"  (1.575 m)     Head Circumference --      Peak Flow --      Pain Score 06/13/18 1021 8     Pain Loc --      Pain Edu? --      Excl. in [place]? --    Updated Vital Signs BP 124/76 (BP Location: Left Arm)   Pulse 79   Temp 97.8 F (36.6 C)  (Oral)   Resp 16   Ht 5\' 2"  (1.575 m)   Wt 86.2 kg   LMP 08/11/2016   SpO2 99%   BMI 34.75 kg/m   Visual Acuity Right Eye Distance:   Left Eye Distance:   Bilateral Distance:    Right Eye Near:   Left Eye Near:    Bilateral Near:     Physical Exam  Constitutional: She is oriented to person, place, and time. She appears well-developed. No distress.  HENT:  Head: Normocephalic and atraumatic.  Cardiovascular: Normal rate and regular rhythm.  Pulmonary/Chest: Effort normal and breath sounds normal. She has no wheezes. She has no rales.  Neurological: She is alert and oriented to person, place, and time.  Psychiatric: She has a normal mood and affect. Her behavior is normal.  Nursing note and vitals reviewed.  UC Treatments / Results  Labs (all labs ordered are listed, but only abnormal results are displayed) Labs Reviewed - No data to display  EKG None  Radiology No results found.  Procedures Procedures (including critical care time)  Medications Ordered in UC Medications - No data to display  Initial Impression / Assessment and Plan / UC Course  I have reviewed the triage vital signs and the nursing notes.  Pertinent labs & imaging results that were available during my care of the patient were reviewed by me and considered in my medical decision making (see chart for details).    47 year old female presents with a likely viral upper respiratory infection.  She does have a history of chronic sinusitis.  Treating with Atrovent nasal spray and Zyrtec-D.  If she fails to improve or worsens, she can start the wait-and-see antibiotic.  Final Clinical Impressions(s) / UC Diagnoses   Final diagnoses:  Upper respiratory tract infection, unspecified type     Discharge Instructions     Medications as prescribed.  If symptoms do not improve, you can start the antibiotic. Does not appear to be bacterial at this time.  Take care  Dr. Lacinda Axon    ED Prescriptions     Medication Sig Dispense Auth. Provider   ipratropium (ATROVENT) 0.06 % nasal spray Place 2 sprays into both nostrils 4 (four) times daily as needed for rhinitis. 15 mL Sruti Ayllon G, DO   cetirizine-pseudoephedrine (ZYRTEC-D) 5-120 MG tablet Take 1 tablet by mouth 2 (two) times daily. 30 tablet Caretha Rumbaugh G, DO   doxycycline (VIBRAMYCIN) 100 MG capsule Take 1 capsule (100 mg total) by mouth 2 (two) times daily. 14 capsule [place], [place], [place]  Controlled Substance Prescriptions [place] Controlled Substance Registry consulted? Not Applicable   Coral Spikes, DO 06/13/18 1059

## 2019-01-24 ENCOUNTER — Ambulatory Visit (INDEPENDENT_AMBULATORY_CARE_PROVIDER_SITE_OTHER): Payer: 59

## 2019-01-24 ENCOUNTER — Encounter: Payer: Self-pay | Admitting: Emergency Medicine

## 2019-01-24 ENCOUNTER — Ambulatory Visit
Admission: EM | Admit: 2019-01-24 | Discharge: 2019-01-24 | Disposition: A | Discharge status: Home / Self Care | Payer: 59 | Attending: Family Medicine | Admitting: Family Medicine

## 2019-01-24 ENCOUNTER — Other Ambulatory Visit: Payer: Self-pay

## 2019-01-24 DIAGNOSIS — R05 Cough: Secondary | ICD-10-CM | POA: Diagnosis not present

## 2019-01-24 DIAGNOSIS — J988 Other specified respiratory disorders: Secondary | ICD-10-CM

## 2019-01-24 MED ORDER — DOXYCYCLINE HYCLATE 100 MG PO CAPS: 100.0000 mg | ORAL_CAPSULE | Freq: Two times a day (BID) | ORAL | 0 refills | Status: DC

## 2019-01-24 MED ORDER — BENZONATATE 200 MG PO CAPS: 200.0000 mg | ORAL_CAPSULE | Freq: Three times a day (TID) | ORAL | 0 refills | Status: DC | PRN

## 2019-01-24 NOTE — ED Provider Notes (Signed)
[facility]    CSN: [id number] Arrival date & time: 01/24/19  0808  History   Chief Complaint Chief Complaint  Patient presents with  . Headache  . Cough   HPI  48 year old female presents with cough, headache, diarrhea, nausea, subjective fever.  Patient reports that she has had a cough for the past week.  Recently developed headache.  Developed diarrhea as of last night.  Reports subjective fever but no documented fever.  Patient reports some facial pain and pressure.  She is taken some over-the-counter medication for cough and cold without relief.  Patient also notes that she has been wheezing.  She states that she has asthma and also continues to smoke.  No reported contacts who had COVID-19.  She was recently tested in late June.  She reports that her pain is 6/10 in severity and is located in the face/head.  No known exacerbating factors.  No other associated symptoms.  No other complaints.  PMH, Surgical Hx, Family Hx, Social History reviewed and updated as below.  Past Medical History:  Diagnosis Date  . Asthma   . Cancer ([place]) 1997   Uterine  . Migraines    Patient Active Problem List   Diagnosis Date Noted  . Fitting and adjustment of intestinal appliance and device   . Bile duct leak     Past Surgical History:  Procedure Laterality Date  . Carpel Tunnel release  2005  . CHOLECYSTECTOMY  07/30/2015  . CHOLECYSTECTOMY N/A 07/30/2015   Procedure: LAPAROSCOPIC CHOLECYSTECTOMY;  Surgeon: Jules Husbands, MD;  Location: [facility] ORS;  Service: General;  Laterality: N/A;  . ERCP N/A 08/06/2015   Procedure: ENDOSCOPIC RETROGRADE CHOLANGIOPANCREATOGRAPHY (ERCP);  Surgeon: Lucilla Lame, MD;  Location: [facility] ENDOSCOPY;  Service: Endoscopy;  Laterality: N/A;  . ERCP N/A 11/03/2015   Procedure: ENDOSCOPIC RETROGRADE CHOLANGIOPANCREATOGRAPHY (ERCP) stent removal;  Surgeon: Lucilla Lame, MD;  Location: [facility] ENDOSCOPY;  Service: Endoscopy;  Laterality: N/A;  . MINOR RELEASE  DORSAL COMPARTMENT (DEQUERVAINS) Right 2005  . NASAL SINUS SURGERY  2006    OB History   No obstetric history on file.      Home Medications    Prior to Admission medications   Medication Sig Start Date End Date Taking? Authorizing Provider  cetirizine-pseudoephedrine (ZYRTEC-D) 5-120 MG tablet Take 1 tablet by mouth 2 (two) times daily. 06/13/18  Yes Jesenia Spera G, DO  ipratropium (ATROVENT) 0.06 % nasal spray Place 2 sprays into both nostrils 4 (four) times daily as needed for rhinitis. 06/13/18  Yes Amberli Ruegg G, DO  albuterol (VENTOLIN HFA) 108 (90 Base) MCG/ACT inhaler Inhale into the lungs every 6 (six) hours as needed for wheezing or shortness of breath.    [provider]  benzonatate (TESSALON) 200 MG capsule Take 1 capsule (200 mg total) by mouth 3 (three) times daily as needed for cough. 01/24/19   Coral Spikes, DO  doxycycline (VIBRAMYCIN) 100 MG capsule Take 1 capsule (100 mg total) by mouth 2 (two) times daily. 01/24/19   Coral Spikes, DO    Family History Family History  Problem Relation Age of Onset  . Cancer Mother        Liver  . Heart disease Father   . Diabetes Father   . Diabetes Sister   . Atrial fibrillation Sister   . Cancer Sister        Ovarian- x 3 sisters  . Diabetes Brother   . Heart disease Paternal Grandmother   . Diabetes  Paternal Grandfather     Social History Social History   Tobacco Use  . Smoking status: Current Every Day Smoker    Packs/day: 0.50    Types: Cigarettes  . Smokeless tobacco: Never Used  Substance Use Topics  . Alcohol use: No  . Drug use: No     Allergies   Prednisone, Amoxicillin, Avelox [moxifloxacin hcl in nacl], Penicillins, Sulfa antibiotics, and Vantin [cefpodoxime]   Review of Systems Review of Systems  Constitutional: Positive for fever.  HENT: Positive for sinus pressure and sinus pain.   Respiratory: Positive for cough and wheezing.   Neurological: Positive for headaches.   Physical  Exam Triage Vital Signs ED Triage Vitals [01/24/19 0820]  Enc Vitals Group     BP 124/90     Pulse Rate 81     Resp (!) 23     Temp 98 F (36.7 C)     Temp Source Oral     SpO2 100 %     Weight 180 lb (81.6 kg)     Height 5\' 2"  (1.575 m)     Head Circumference      Peak Flow      Pain Score 6     Pain Loc      Pain Edu?      Excl. in [place]?    Updated Vital Signs BP 124/90   Pulse 81   Temp 98 F (36.7 C) (Oral)   Resp (!) 23   Ht 5\' 2"  (1.575 m)   Wt 81.6 kg   LMP 08/11/2016   SpO2 100%   BMI 32.92 kg/m   Visual Acuity Right Eye Distance:   Left Eye Distance:   Bilateral Distance:    Right Eye Near:   Left Eye Near:    Bilateral Near:     Physical Exam Vitals signs and nursing note reviewed.  Constitutional:      General: She is not in acute distress.    Appearance: Normal appearance.  HENT:     Head: Normocephalic and atraumatic.  Eyes:     General:        Right eye: No discharge.        Left eye: No discharge.     Conjunctiva/sclera: Conjunctivae normal.  Cardiovascular:     Rate and Rhythm: Normal rate and regular rhythm.  Pulmonary:     Effort: Pulmonary effort is normal.     Breath sounds: Normal breath sounds. No wheezing, rhonchi or rales.  Neurological:     Mental Status: She is alert.  Psychiatric:        Mood and Affect: Mood normal.        Behavior: Behavior normal.    UC Treatments / Results  Labs (all labs ordered are listed, but only abnormal results are displayed) Labs Reviewed - No data to display  EKG   Radiology Dg Chest 2 View  Result Date: 01/24/2019 CLINICAL DATA:  Cough 1 week EXAM: CHEST - 2 VIEW COMPARISON:  04/27/2018 FINDINGS: Heart size mildly enlarged. Negative for heart failure. Lungs well aerated without infiltrate edema. No effusion. IMPRESSION: No active cardiopulmonary disease. Electronically Signed   By: Franchot Gallo M.D.   On: 01/24/2019 09:13    Procedures Procedures (including critical care time)   Medications Ordered in UC Medications - No data to display  Initial Impression / Assessment and Plan / UC Course  I have reviewed the triage vital signs and the nursing notes.  Pertinent labs & imaging  results that were available during my care of the patient were reviewed by me and considered in my medical decision making (see chart for details).    48 year old female presents with respiratory infection.  Has features of sinusitis and bronchitis.  Given persistent symptoms and lack of improvement, I am placing her on doxycycline and Tessalon Perles.  Final Clinical Impressions(s) / UC Diagnoses   Final diagnoses:  Respiratory infection     Discharge Instructions     Medication as prescribed.  Take care  Dr. Lacinda Axon     ED Prescriptions    Medication Sig Dispense Auth. Provider   doxycycline (VIBRAMYCIN) 100 MG capsule Take 1 capsule (100 mg total) by mouth 2 (two) times daily. 14 capsule Shavaun Osterloh G, DO   benzonatate (TESSALON) 200 MG capsule Take 1 capsule (200 mg total) by mouth 3 (three) times daily as needed for cough. 30 capsule Coral Spikes, DO     Controlled Substance Prescriptions [place] Controlled Substance Registry consulted? Not Applicable   Coral Spikes, DO 01/24/19 0930

## 2019-01-24 NOTE — Discharge Instructions (Signed)
Medication as prescribed.  Take care  Dr. Adren Dollins  

## 2019-01-24 NOTE — ED Triage Notes (Signed)
Pt c/o headache, nausea and cough since sunday. Reports diarrhea last night. Unsure of fever, reports "burning up and then a cold sweat".

## 2019-02-28 ENCOUNTER — Encounter: Payer: Self-pay | Admitting: Emergency Medicine

## 2019-02-28 ENCOUNTER — Ambulatory Visit
Admission: EM | Admit: 2019-02-28 | Discharge: 2019-02-28 | Disposition: A | Discharge status: Home / Self Care | Payer: Worker's Compensation | Attending: Urgent Care | Admitting: Urgent Care

## 2019-02-28 ENCOUNTER — Ambulatory Visit (INDEPENDENT_AMBULATORY_CARE_PROVIDER_SITE_OTHER): Payer: Worker's Compensation

## 2019-02-28 ENCOUNTER — Other Ambulatory Visit: Payer: Self-pay

## 2019-02-28 DIAGNOSIS — Z9889 Other specified postprocedural states: Secondary | ICD-10-CM | POA: Diagnosis not present

## 2019-02-28 DIAGNOSIS — Y990 Civilian activity done for income or pay: Secondary | ICD-10-CM | POA: Diagnosis not present

## 2019-02-28 DIAGNOSIS — M25531 Pain in right wrist: Secondary | ICD-10-CM | POA: Diagnosis not present

## 2019-02-28 MED ORDER — NAPROXEN 500 MG PO TABS: 500.0000 mg | ORAL_TABLET | Freq: Two times a day (BID) | ORAL | 0 refills | Status: DC

## 2019-02-28 MED ORDER — TRAMADOL HCL 50 MG PO TABS: 50.0000 mg | ORAL_TABLET | Freq: Two times a day (BID) | ORAL | 0 refills | Status: DC | PRN

## 2019-02-28 NOTE — Discharge Instructions (Addendum)
It was very nice seeing you today in clinic. Thank you for entrusting me with your care.   Wear splint. Use ice to help with pain and swelling. Take medications as prescribed. Your prescriptions have been called in to your pharmacy.   Make arrangements to follow up with occupational medicine provider for ongoing care. May need to be referred to orthopedics if not improving. Return to work clearance will need to come from occupational medicine. If your symptoms/condition worsens, please seek follow up care either here or in the ER. Please remember, our [place] providers are "right here with you" when you need [place].   Again, it was my pleasure to take care of you today. Thank you for choosing our clinic. I hope that you start to feel better quickly.   Honor Loh, MSN, APRN, FNP-C, CEN Advanced Practice Provider [facility]

## 2019-02-28 NOTE — ED Triage Notes (Signed)
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 She states her hand and wrist are swelling and burning.  This is a worker's comp case.

## 2019-02-28 NOTE — ED Provider Notes (Signed)
[place], [place]   Name: Sydney Wilson DOB: 1970-12-31 MRN: [id number] CSN: [id number] PCP: Patient, No Pcp Per  Arrival date and time:  02/28/19 1500  Chief Complaint:  Hand Pain, Wrist Pain, and worker's comp   NOTE: Prior to seeing the patient today, I have reviewed the triage nursing documentation and vital signs. Clinical staff has updated patient's PMH/PSHx, current medication list, and drug allergies/intolerances to ensure comprehensive history available to assist in medical decision making.   History:   HPI: Sydney Wilson is a 48 y.o. female who presents today with complaints of pain and swelling in her RIGHT wrist that began with acute onset yesterday while at work. Patient reports that she was performing her normal job duties today at [employer] when the injury occurred. 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 Patient describes the pain as a burning sensation. She states, "it burns like pins and needles".  Patient informed her supervisor. Since her pain began, patient has been taking APAP, which has done little to improve her pain. Patient notes that pain and swelling exacerbated during the night. Pain caused her to experience difficulties sleeping. Selling persists to the point of preventing patient from making a fist. PMH (+) for DeQuervain's tenosynovitis and carpal tunnel release. She is RIGHT hand dominant.   Past Medical History:  Diagnosis Date   Asthma    Cancer [facility]) 1997   Uterine   Migraines     Past Surgical History:  Procedure Laterality Date   Carpel Tunnel release  2005   CHOLECYSTECTOMY  07/30/2015   CHOLECYSTECTOMY N/A 07/30/2015   Procedure: LAPAROSCOPIC CHOLECYSTECTOMY;  Surgeon: Jules Husbands, MD;  Location: [facility] ORS;  Service: General;  Laterality: N/A;   ERCP N/A 08/06/2015   Procedure: ENDOSCOPIC RETROGRADE CHOLANGIOPANCREATOGRAPHY (ERCP);  Surgeon: Lucilla Lame, MD;  Location: [facility] ENDOSCOPY;  Service: Endoscopy;  Laterality: N/A;   ERCP N/A 11/03/2015   Procedure: ENDOSCOPIC RETROGRADE CHOLANGIOPANCREATOGRAPHY (ERCP) stent removal;  Surgeon: Lucilla Lame, MD;  Location: [facility] ENDOSCOPY;  Service: Endoscopy;  Laterality: N/A;   MINOR RELEASE DORSAL COMPARTMENT (DEQUERVAINS) Right 2005   NASAL SINUS SURGERY  2006    Family History  Problem Relation Age of Onset   Cancer Mother        Liver   Heart disease Father    Diabetes Father    Diabetes Sister    Atrial fibrillation Sister    Cancer Sister        Ovarian- x 3 sisters   Diabetes Brother    Heart disease Paternal Grandmother    Diabetes Paternal Grandfather     Social History   Tobacco Use   Smoking status: Current Every Day Smoker    Packs/day: 0.50    Types: Cigarettes   Smokeless tobacco: Never Used  Substance Use Topics   Alcohol use: No   Drug use: No    Patient Active Problem List   Diagnosis Date Noted   Fitting and adjustment of intestinal appliance and device    Bile duct leak     Home Medications:    Current Meds  Medication Sig   albuterol (VENTOLIN HFA) 108 (90 Base) MCG/ACT inhaler Inhale into the lungs every 6 (six) hours as needed for wheezing or shortness of breath.    Allergies:   Prednisone, Amoxicillin, Avelox [moxifloxacin hcl in nacl], Penicillins, Sulfa antibiotics, and Vantin [cefpodoxime]  Review  of Systems (ROS): Review of Systems  Constitutional: Negative for chills and fever.  Respiratory: Negative for cough and shortness of breath.   Cardiovascular: Negative for chest pain and palpitations.  Musculoskeletal:       Acute RIGHT hand/wrist pain  Neurological: Positive for weakness (RIGHT wrist and hand) and numbness (RIGHT wrist and hand).  All other systems reviewed and are negative.    Vital Signs: Today's Vitals   02/28/19 1520 02/28/19 1523 02/28/19 1706  BP:  118/75   Pulse:  78   Resp:  18   Temp:  98.2  F (36.8 C)   TempSrc:  Oral   SpO2:  97%   Weight: 180 lb (81.6 kg)    Height: 5\' 2"  (1.575 m)    PainSc: 8   8     Physical Exam: Physical Exam  Constitutional: She is oriented to person, place, and time and well-developed, well-nourished, and in no distress.  HENT:  Head: Normocephalic and atraumatic.  Mouth/Throat: Mucous membranes are normal.  Eyes: Pupils are equal, round, and reactive to light. EOM are normal.  Cardiovascular: Normal rate, regular rhythm, normal heart sounds and intact distal pulses. Exam reveals no gallop and no friction rub.  No murmur heard. Pulmonary/Chest: Effort normal and breath sounds normal. No respiratory distress. She has no wheezes. She has no rales.  Musculoskeletal:     Right wrist: She exhibits decreased range of motion, tenderness and swelling. She exhibits no crepitus and no deformity.     Right hand: She exhibits decreased range of motion, tenderness and swelling. She exhibits no bony tenderness, normal two-point discrimination, normal capillary refill and no deformity. Normal sensation noted. Decreased strength noted. She exhibits wrist extension trouble.  Neurological: She is alert and oriented to person, place, and time. Gait normal.  Skin: Skin is warm and dry. No rash noted.  Psychiatric: Mood, memory, affect and judgment normal.  Nursing note and vitals reviewed.   Urgent Care Treatments / Results:   LABS: PLEASE NOTE: all labs that were ordered this encounter are listed, however only abnormal results are displayed. Labs Reviewed - No data to display  EKG: -None  RADIOLOGY: Dg Wrist Complete Right  Result Date: 02/28/2019 CLINICAL DATA:  Swelling and pain, right wrist EXAM: RIGHT WRIST - COMPLETE 3+ VIEW COMPARISON:  None. FINDINGS: No fracture or dislocation of the right wrist. The carpus is normally aligned. Joint spaces are well preserved. Soft tissues are unremarkable. IMPRESSION: No fracture or dislocation of the right  wrist. The carpus is normally aligned. Joint spaces are well preserved. Soft tissues are unremarkable. Electronically Signed   By: Eddie Candle M.D.   On: 02/28/2019 16:01    PROCEDURES: Procedures  MEDICATIONS RECEIVED THIS VISIT: Medications - No data to display  PERTINENT CLINICAL COURSE NOTES/UPDATES:   Initial Impression / Assessment and Plan / Urgent Care Course:  Pertinent labs & imaging results that were available during my care of the patient were personally reviewed by me and considered in my medical decision making (see lab/imaging section of note for values and interpretations).  Sydney Wilson is a 48 y.o. female who presents to [facility] today with complaints of pain and swelling in her RIGHT hand and wrist following a work related injury.   Patient is well appearing overall in clinic today. She does not appear to be in any acute distress. Presenting symptoms (see HPI) and exam as documented above. Diagnostic radiographs of the RIGHT wrist reviewed as negative for  acute fracture or dislocation. Symptoms felt to be related to repetitive motion injury in the setting of PMH known to be (+) for DeQuervain's tenosynovitis. She notes that she has not experienced any issues with her hand/wrist following her CTR surgery. Discussed treatment options. Ideally, with the paraesthesias in the hand, I would like to treat with systemic steroids, however patient notes that steroids cause her to be short of breath. Will immobilize wrist in a cockup splint. She was encouraged to elevate hand/wrist, and to apply ice TID for at least 10-15 minutes at a time. Pain not controlled with APAP alone, thus will add naprosyn BID and provide a short course of Tramadol for more severe pain.   Patient needs to be seen for further evaluation by occupational medicine for return to work/full duty clearance. Name and office contact information provided on today's AVS for [facility], NP. Patient advised the she  will need to contact the office to schedule an appointment to be seen.   I have reviewed the follow up and strict return precautions for any new or worsening symptoms. Patient is aware of symptoms that would be deemed urgent/emergent, and would thus require further evaluation either here or in the emergency department. At the time of discharge, she verbalized understanding and consent with the discharge plan as it was reviewed with her. All questions were fielded by provider and/or clinic staff prior to patient discharge.    Final Clinical Impressions / Urgent Care Diagnoses:   Final diagnoses:  Right wrist pain  History of carpal tunnel surgery  Work related injury    New Prescriptions:  [place] Controlled Substance Registry consulted? Yes, I have consulted the [place] Controlled Substances Registry for this patient, and feel the risk/benefit ratio today is favorable for proceeding with this prescription for a controlled substance.   Discussed use of controlled substance medication to treat her acute pain.  o Reviewed [place] STOP Act regulations  o Clinic does not refill controlled substances over the phone without face to face evaluation.   Safety precautions reviewed.  o Medications should not be bitten, chewed, crushed, shared, or taken with alcohol.  o Avoid use while working, driving, or operating heavy machinery.  o Side effects associated with the use of this particular medication reviewed. - Patient understands that this medication can cause CNS depression, increase her risk of falls, and even lead to overdose that may result in death, if used outside of the parameters that she and I discussed.  With all of this in mind, she knowingly accepts the risks and responsibilities associated with intended course of treatment, and elects to responsibly proceed as discussed.  Meds ordered this encounter  Medications   naproxen (NAPROSYN) 500 MG tablet    Sig: Take 1 tablet (500 mg total) by mouth 2  (two) times daily.    Dispense:  30 tablet    Refill:  0   traMADol (ULTRAM) 50 MG tablet    Sig: Take 1 tablet (50 mg total) by mouth every 12 (twelve) hours as needed.    Dispense:  10 tablet    Refill:  0    Recommended Follow up Care:  Patient encouraged to follow up with the following provider within the specified time frame, or sooner as dictated by the severity of her symptoms. As always, she was instructed that for any urgent/emergent care needs, she should seek care either here or in the emergency department for more immediate evaluation.  Follow-up Information    Call  Maximino Sarin, FNP.   Specialty: Family Medicine Why: Need to see for return to work clearance. Contact information: [place] [address] [place] [address] [phone]         NOTE: This note was prepared using Dragon dictation software along with smaller phrase technology. Despite my best ability to proofread, there is the potential that transcriptional errors may still occur from this process, and are completely unintentional.     Karen Kitchens, NP 02/28/19 2311

## 2019-07-24 ENCOUNTER — Other Ambulatory Visit: Payer: Self-pay

## 2019-07-24 ENCOUNTER — Ambulatory Visit
Admission: EM | Admit: 2019-07-24 | Discharge: 2019-07-24 | Disposition: A | Discharge status: Home / Self Care | Payer: BC Managed Care – PPO | Attending: Family Medicine | Admitting: Family Medicine

## 2019-07-24 DIAGNOSIS — Z20822 Contact with and (suspected) exposure to covid-19: Secondary | ICD-10-CM | POA: Insufficient documentation

## 2019-07-24 DIAGNOSIS — J988 Other specified respiratory disorders: Secondary | ICD-10-CM | POA: Insufficient documentation

## 2019-07-24 DIAGNOSIS — F17210 Nicotine dependence, cigarettes, uncomplicated: Secondary | ICD-10-CM

## 2019-07-24 DIAGNOSIS — B9789 Other viral agents as the cause of diseases classified elsewhere: Secondary | ICD-10-CM | POA: Diagnosis not present

## 2019-07-24 MED ORDER — BENZONATATE 200 MG PO CAPS: 200.0000 mg | ORAL_CAPSULE | Freq: Three times a day (TID) | ORAL | 0 refills | Status: DC | PRN

## 2019-07-24 MED ORDER — IPRATROPIUM BROMIDE 0.06 % NA SOLN: 2.0000 | Freq: Four times a day (QID) | NASAL | 0 refills | Status: AC | PRN

## 2019-07-24 NOTE — Discharge Instructions (Signed)
Medication as directed for congestion and cough.  Stay home.  COVID test results should be back in 24-48 hours.  Take care  Dr. Lacinda Axon

## 2019-07-24 NOTE — ED Provider Notes (Signed)
[facility]    CSN: [id number] Arrival date & time: 07/24/19  1005  History   Chief Complaint Chief Complaint  Patient presents with  . Cough  . Nasal Congestion   HPI  49 year old female presents with the above complaints.  Patient reports that her symptoms started on Sunday.  She reports sinus congestion, productive cough, ear pain, headache.  She has had multiple sick contacts.  She has had exposure to COVID-19.  Patient reports sweating but is not sure of fever.  Patient is concerned that she has a sinus infection.  She is also concerned about the possibility of COVID-19.  Desires testing today.  No relieving factors.  No other complaints.  Past Medical History:  Diagnosis Date  . Asthma   . Cancer ([place]) 1997   Uterine  . Migraines    Past Surgical History:  Procedure Laterality Date  . Carpel Tunnel release  2005  . CHOLECYSTECTOMY  07/30/2015  . CHOLECYSTECTOMY N/A 07/30/2015   Procedure: LAPAROSCOPIC CHOLECYSTECTOMY;  Surgeon: Jules Husbands, MD;  Location: [facility] ORS;  Service: General;  Laterality: N/A;  . ERCP N/A 08/06/2015   Procedure: ENDOSCOPIC RETROGRADE CHOLANGIOPANCREATOGRAPHY (ERCP);  Surgeon: Lucilla Lame, MD;  Location: [facility] ENDOSCOPY;  Service: Endoscopy;  Laterality: N/A;  . ERCP N/A 11/03/2015   Procedure: ENDOSCOPIC RETROGRADE CHOLANGIOPANCREATOGRAPHY (ERCP) stent removal;  Surgeon: Lucilla Lame, MD;  Location: [facility] ENDOSCOPY;  Service: Endoscopy;  Laterality: N/A;  . MINOR RELEASE DORSAL COMPARTMENT (DEQUERVAINS) Right 2005  . NASAL SINUS SURGERY  2006    OB History   No obstetric history on file.      Home Medications    Prior to Admission medications   Medication Sig Start Date End Date Taking? Authorizing Provider  albuterol (VENTOLIN HFA) 108 (90 Base) MCG/ACT inhaler Inhale into the lungs every 6 (six) hours as needed for wheezing or shortness of breath.   Yes [provider]  cetirizine (ZYRTEC) 10 MG tablet Take 10 mg by  mouth daily.   Yes [provider]  benzonatate (TESSALON) 200 MG capsule Take 1 capsule (200 mg total) by mouth 3 (three) times daily as needed for cough. 07/24/19   Thersa Salt G, DO  ipratropium (ATROVENT) 0.06 % nasal spray Place 2 sprays into both nostrils 4 (four) times daily as needed for rhinitis. 07/24/19   Coral Spikes, DO    Family History Family History  Problem Relation Age of Onset  . Cancer Mother        Liver  . Heart disease Father   . Diabetes Father   . Diabetes Sister   . Atrial fibrillation Sister   . Cancer Sister        Ovarian- x 3 sisters  . Diabetes Brother   . Heart disease Paternal Grandmother   . Diabetes Paternal Grandfather     Social History Social History   Tobacco Use  . Smoking status: Current Every Day Smoker    Packs/day: 0.50    Types: Cigarettes  . Smokeless tobacco: Never Used  Substance Use Topics  . Alcohol use: No  . Drug use: No     Allergies   Prednisone, Amoxicillin, Avelox [moxifloxacin hcl in nacl], Penicillins, Sulfa antibiotics, and Vantin [cefpodoxime]   Review of Systems Review of Systems  Constitutional: Negative for fever.  HENT: Positive for congestion and ear pain.   Respiratory: Positive for cough.   Neurological: Positive for headaches.   Physical Exam Triage Vital Signs ED Triage Vitals  Enc  Vitals Group     BP 07/24/19 1025 121/74     Pulse Rate 07/24/19 1025 95     Resp --      Temp 07/24/19 1025 98 F (36.7 C)     Temp Source 07/24/19 1025 Oral     SpO2 07/24/19 1025 97 %     Weight 07/24/19 1022 179 lb (81.2 kg)     Height 07/24/19 1022 5\' 2"  (1.575 m)     Head Circumference --      Peak Flow --      Pain Score 07/24/19 1022 0     Pain Loc --      Pain Edu? --      Excl. in [place]? --    Updated Vital Signs BP 121/74 (BP Location: Left Arm)   Pulse 95   Temp 98 F (36.7 C) (Oral)   Ht 5\' 2"  (1.575 m)   Wt 81.2 kg   LMP 08/11/2016   SpO2 97%   BMI 32.74 kg/m   Visual  Acuity Right Eye Distance:   Left Eye Distance:   Bilateral Distance:    Right Eye Near:   Left Eye Near:    Bilateral Near:     Physical Exam Vitals and nursing note reviewed.  Constitutional:      General: She is not in acute distress.    Appearance: Normal appearance. She is not ill-appearing.  HENT:     Head: Normocephalic and atraumatic.     Ears:     Comments: Left TM with tympanostomy tube. Right TM -perforation noted.  No evidence of erythema    Mouth/Throat:     Pharynx: Posterior oropharyngeal erythema present. No oropharyngeal exudate.  Eyes:     General:        Right eye: No discharge.        Left eye: No discharge.     Conjunctiva/sclera: Conjunctivae normal.  Cardiovascular:     Rate and Rhythm: Normal rate and regular rhythm.     Heart sounds: No murmur.  Pulmonary:     Effort: Pulmonary effort is normal.     Breath sounds: Normal breath sounds. No wheezing, rhonchi or rales.  Neurological:     Mental Status: She is alert.  Psychiatric:        Mood and Affect: Mood normal.        Behavior: Behavior normal.    UC Treatments / Results  Labs (all labs ordered are listed, but only abnormal results are displayed) Labs Reviewed  NOVEL CORONAVIRUS, NAA (HOSP ORDER, SEND-OUT TO REF LAB; TAT 18-24 HRS)    EKG   Radiology No results found.  Procedures Procedures (including critical care time)  Medications Ordered in UC Medications - No data to display  Initial Impression / Assessment and Plan / UC Course  I have reviewed the triage vital signs and the nursing notes.  Pertinent labs & imaging results that were available during my care of the patient were reviewed by me and considered in my medical decision making (see chart for details).    49 year old female presents with a viral respiratory infection.  Possible COVID-19.  Atrovent nasal spray and Tessalon Perles for symptomatic treatment.  Awaiting Covid test results.  Final Clinical  Impressions(s) / UC Diagnoses   Final diagnoses:  Viral respiratory infection  Encounter for laboratory testing for COVID-19 virus     Discharge Instructions     Medication as directed for congestion and cough.  Stay home.  COVID test results should be back in 24-48 hours.  Take care  Dr. Lacinda Axon    ED Prescriptions    Medication Sig Dispense Auth. Provider   ipratropium (ATROVENT) 0.06 % nasal spray Place 2 sprays into both nostrils 4 (four) times daily as needed for rhinitis. 15 mL Lucina Betty G, DO   benzonatate (TESSALON) 200 MG capsule Take 1 capsule (200 mg total) by mouth 3 (three) times daily as needed for cough. 30 capsule Coral Spikes, DO     PDMP not reviewed this encounter.   Coral Spikes, [place] 07/24/19 1206

## 2019-07-24 NOTE — ED Triage Notes (Signed)
Pt presents with c/o sinus congestion with yellow mucus, productive cough, right ear pain and headache. She reports symptoms started this past Sunday. She does report possible multiple sick contacts, at work and home. She is having night sweats, but is unsure about fevers.

## 2019-07-25 LAB — NOVEL CORONAVIRUS, NAA (HOSP ORDER, SEND-OUT TO REF LAB; TAT 18-24 HRS): SARS-CoV-2, NAA: NOT DETECTED

## 2019-07-26 ENCOUNTER — Telehealth: Payer: Self-pay | Admitting: Emergency Medicine

## 2019-07-26 NOTE — Telephone Encounter (Signed)
Returning patient's call.  Patient's name and DOB was verified.  Patient was notified that her COVID test was Negative.  Patient was notified to call or follow-up here if her symptoms persist or worsen.  Patient verbalized understanding.

## 2019-08-15 ENCOUNTER — Other Ambulatory Visit: Payer: Self-pay | Admitting: Family Medicine

## 2019-10-07 ENCOUNTER — Ambulatory Visit: Payer: BC Managed Care – PPO | Attending: Internal Medicine

## 2019-10-07 ENCOUNTER — Other Ambulatory Visit: Payer: Self-pay

## 2019-10-07 DIAGNOSIS — Z23 Encounter for immunization: Secondary | ICD-10-CM

## 2019-10-07 NOTE — Progress Notes (Signed)
   Covid-19 Vaccination Clinic  Name:  Sydney Wilson    MRN: [id number] DOB: 09-Dec-1970  10/07/2019  [name] was observed post Covid-19 immunization for 15 minutes without incident. She was provided with Vaccine Information Sheet and instruction to access the V-Safe system.   [name] was instructed to call 911 with any severe reactions post vaccine: Marland Kitchen Difficulty breathing  . Swelling of face and throat  . A fast heartbeat  . A bad rash all over body  . Dizziness and weakness   Immunizations Administered    Name Date Dose VIS Date Route   Pfizer COVID-19 Vaccine 10/07/2019  2:28 PM 0.3 mL 06/14/2019 Intramuscular   Manufacturer: [place]   Lot: [id number]   [place]: [id number]

## 2019-10-30 ENCOUNTER — Ambulatory Visit: Payer: BC Managed Care – PPO | Attending: Internal Medicine

## 2019-10-30 DIAGNOSIS — Z23 Encounter for immunization: Secondary | ICD-10-CM

## 2019-10-30 NOTE — Progress Notes (Signed)
   Covid-19 Vaccination Clinic  Name:  MELIYA ROSELL    MRN: [id number] DOB: [birth year]  10/30/2019  Ms. Gatti was observed post Covid-19 immunization for 15 minutes without incident. She was provided with Vaccine Information Sheet and instruction to access the V-Safe system.   Ms. Otten was instructed to call 911 with any severe reactions post vaccine: Marland Kitchen Difficulty breathing  . Swelling of face and throat  . A fast heartbeat  . A bad rash all over body  . Dizziness and weakness   Immunizations Administered    Name Date Dose VIS Date Route   Pfizer COVID-19 Vaccine 10/30/2019  1:55 PM 0.3 mL 08/28/2018 Intramuscular   Manufacturer: [place]   Lot: [id number]   [place]: [id number]

## 2021-07-09 ENCOUNTER — Encounter: Payer: Self-pay | Admitting: Emergency Medicine

## 2021-07-09 ENCOUNTER — Ambulatory Visit
Admission: EM | Admit: 2021-07-09 | Discharge: 2021-07-09 | Disposition: A | Discharge status: Home / Self Care | Payer: BC Managed Care – PPO | Attending: Medical Oncology | Admitting: Medical Oncology

## 2021-07-09 ENCOUNTER — Other Ambulatory Visit: Payer: Self-pay

## 2021-07-09 DIAGNOSIS — J0100 Acute maxillary sinusitis, unspecified: Secondary | ICD-10-CM

## 2021-07-09 DIAGNOSIS — J4531 Mild persistent asthma with (acute) exacerbation: Secondary | ICD-10-CM

## 2021-07-09 DIAGNOSIS — F17200 Nicotine dependence, unspecified, uncomplicated: Secondary | ICD-10-CM | POA: Diagnosis not present

## 2021-07-09 MED ORDER — BENZONATATE 200 MG PO CAPS: 200.0000 mg | ORAL_CAPSULE | Freq: Three times a day (TID) | ORAL | 0 refills | Status: AC | PRN

## 2021-07-09 MED ORDER — AZITHROMYCIN 250 MG PO TABS: 250.0000 mg | ORAL_TABLET | Freq: Every day | ORAL | 0 refills | Status: AC

## 2021-07-09 MED ORDER — ALBUTEROL SULFATE HFA 108 (90 BASE) MCG/ACT IN AERS: 1.0000 | INHALATION_SPRAY | Freq: Four times a day (QID) | RESPIRATORY_TRACT | 0 refills | Status: AC | PRN

## 2021-07-09 NOTE — ED Provider Notes (Signed)
[facility]    CSN: [id number] Arrival date & time: 07/09/21  0858      History   Chief Complaint Chief Complaint  Patient presents with   Cough   Sore Throat    HPI FANY CAVANAUGH is a 51 y.o. female.   HPI  Cold Symptoms: Patient reports that they have had symptoms of ear pain, sore throat, cough and nasal congestion for the past 3 days. Symptoms of sore throat and ear pain are worsening. They deny SOB, chest pain, fever or vomiting. They have tried their albuterol inhaler and halls for symptoms. No known sick contacts.    Past Medical History:  Diagnosis Date   Asthma    Cancer ([place]) 1997   Uterine   Migraines     Patient Active Problem List   Diagnosis Date Noted   Fitting and adjustment of intestinal appliance and device    Bile duct leak     Past Surgical History:  Procedure Laterality Date   Carpel Tunnel release  2005   CHOLECYSTECTOMY  07/30/2015   CHOLECYSTECTOMY N/A 07/30/2015   Procedure: LAPAROSCOPIC CHOLECYSTECTOMY;  Surgeon: Jules Husbands, MD;  Location: [facility] ORS;  Service: General;  Laterality: N/A;   ERCP N/A 08/06/2015   Procedure: ENDOSCOPIC RETROGRADE CHOLANGIOPANCREATOGRAPHY (ERCP);  Surgeon: Lucilla Lame, MD;  Location: [facility] ENDOSCOPY;  Service: Endoscopy;  Laterality: N/A;   ERCP N/A 11/03/2015   Procedure: ENDOSCOPIC RETROGRADE CHOLANGIOPANCREATOGRAPHY (ERCP) stent removal;  Surgeon: Lucilla Lame, MD;  Location: [facility] ENDOSCOPY;  Service: Endoscopy;  Laterality: N/A;   MINOR RELEASE DORSAL COMPARTMENT (DEQUERVAINS) Right 2005   NASAL SINUS SURGERY  2006    OB History   No obstetric history on file.      Home Medications    Prior to Admission medications   Medication Sig Start Date End Date Taking? Authorizing Provider  albuterol (VENTOLIN HFA) 108 (90 Base) MCG/ACT inhaler Inhale into the lungs every 6 (six) hours as needed for wheezing or shortness of breath.   Yes [provider]  benzonatate (TESSALON) 200 MG  capsule Take 1 capsule (200 mg total) by mouth 3 (three) times daily as needed for cough. 07/24/19   Coral Spikes, DO  cetirizine (ZYRTEC) 10 MG tablet Take 10 mg by mouth daily.    [provider]  ipratropium (ATROVENT) 0.06 % nasal spray Place 2 sprays into both nostrils 4 (four) times daily as needed for rhinitis. 07/24/19   Coral Spikes, DO    Family History Family History  Problem Relation Age of Onset   Cancer Mother        Liver   Heart disease Father    Diabetes Father    Diabetes Sister    Atrial fibrillation Sister    Cancer Sister        Ovarian- x 3 sisters   Diabetes Brother    Heart disease Paternal Grandmother    Diabetes Paternal Grandfather     Social History Social History   Tobacco Use   Smoking status: Every Day    Packs/day: 0.50    Types: Cigarettes   Smokeless tobacco: Never  Vaping Use   Vaping Use: Never used  Substance Use Topics   Alcohol use: No   Drug use: No     Allergies   Prednisone, Amoxicillin, Avelox [moxifloxacin hcl in nacl], Penicillins, Sulfa antibiotics, and Vantin [cefpodoxime]   Review of Systems Review of Systems  As stated above in HPI Physical Exam Triage Vital Signs  ED Triage Vitals  Enc Vitals Group     BP 07/09/21 0925 139/73     Pulse Rate 07/09/21 0925 79     Resp 07/09/21 0925 14     Temp 07/09/21 0925 98.2 F (36.8 C)     Temp Source 07/09/21 0925 Oral     SpO2 07/09/21 0925 96 %     Weight 07/09/21 0923 179 lb 0.2 oz (81.2 kg)     Height 07/09/21 0923 5\' 2"  (1.575 m)     Head Circumference --      Peak Flow --      Pain Score 07/09/21 0923 4     Pain Loc --      Pain Edu? --      Excl. in [place]? --    No data found.  Updated Vital Signs BP 139/73 (BP Location: Left Arm)    Pulse 79    Temp 98.2 F (36.8 C) (Oral)    Resp 14    Ht 5\' 2"  (1.575 m)    Wt 179 lb 0.2 oz (81.2 kg)    LMP 08/11/2016    SpO2 96%    BMI 32.74 kg/m   Physical Exam Vitals and nursing note reviewed.   Constitutional:      General: She is not in acute distress.    Appearance: Normal appearance. She is not ill-appearing, toxic-appearing or diaphoretic.  HENT:     Head: Normocephalic and atraumatic.     Ears:     Comments: Bilateral TM with perforation without erythema or drainage (chronic)    Nose: Congestion (Mild maxillary bogginess) and rhinorrhea present.     Mouth/Throat:     Mouth: Mucous membranes are moist.     Pharynx: Oropharynx is clear. No oropharyngeal exudate or posterior oropharyngeal erythema.  Eyes:     General:        Right eye: No discharge.        Left eye: No discharge.     Extraocular Movements: Extraocular movements intact.     Conjunctiva/sclera: Conjunctivae normal.     Pupils: Pupils are equal, round, and reactive to light.  Cardiovascular:     Rate and Rhythm: Normal rate and regular rhythm.     Heart sounds: Normal heart sounds.  Pulmonary:     Effort: Pulmonary effort is normal. No respiratory distress.     Breath sounds: No stridor. Wheezing (mild throughour) present. No rhonchi.  Abdominal:     Palpations: Abdomen is soft.  Musculoskeletal:     Cervical back: Normal range of motion and neck supple.  Lymphadenopathy:     Cervical: No cervical adenopathy.  Skin:    General: Skin is warm.     Coloration: Skin is not jaundiced.     Findings: No erythema or rash.  Neurological:     Mental Status: She is alert and oriented to person, place, and time.     UC Treatments / Results  Labs (all labs ordered are listed, but only abnormal results are displayed) Labs Reviewed - No data to display  EKG   Radiology No results found.  Procedures Procedures (including critical care time)  Medications Ordered in UC Medications - No data to display  Initial Impression / Assessment and Plan / UC Course  I have reviewed the triage vital signs and the nursing notes.  Pertinent labs & imaging results that were available during my care of the patient  were reviewed by me and considered in my medical decision  making (see chart for details).     New. Appears viral in nature. Patient declines viral testing for influenza or COVID-19. Treating with albuterol, azithromycin given her smoking status with suspected COPD along with sinus bogginess. She is not able to take steroids given allergy so we will avoid this. Discussed red flag signs and symptoms.  Final Clinical Impressions(s) / UC Diagnoses   Final diagnoses:  None   Discharge Instructions   None    ED Prescriptions   None    PDMP not reviewed this encounter.   Hughie Closs, [place] 07/09/21 1102

## 2021-07-09 NOTE — ED Triage Notes (Signed)
Patient c/o bilateral ear pain and sore throat that started yesterday.   Patient states that she has had cough and congestion for 3 days.  Patient denies fevers.

## 2021-07-18 ENCOUNTER — Emergency Department
Admission: EM | Admit: 2021-07-18 | Discharge: 2021-07-18 | Disposition: A | Discharge status: Home / Self Care | Payer: BC Managed Care – PPO | Attending: Emergency Medicine | Admitting: Emergency Medicine

## 2021-07-18 ENCOUNTER — Other Ambulatory Visit: Payer: Self-pay

## 2021-07-18 ENCOUNTER — Emergency Department: Payer: BC Managed Care – PPO

## 2021-07-18 ENCOUNTER — Encounter: Payer: Self-pay | Admitting: Emergency Medicine

## 2021-07-18 DIAGNOSIS — Z720 Tobacco use: Secondary | ICD-10-CM

## 2021-07-18 DIAGNOSIS — I3139 Other pericardial effusion (noninflammatory): Secondary | ICD-10-CM | POA: Diagnosis not present

## 2021-07-18 DIAGNOSIS — R918 Other nonspecific abnormal finding of lung field: Secondary | ICD-10-CM | POA: Diagnosis not present

## 2021-07-18 DIAGNOSIS — Z20822 Contact with and (suspected) exposure to covid-19: Secondary | ICD-10-CM | POA: Insufficient documentation

## 2021-07-18 DIAGNOSIS — J209 Acute bronchitis, unspecified: Secondary | ICD-10-CM | POA: Diagnosis not present

## 2021-07-18 DIAGNOSIS — R109 Unspecified abdominal pain: Secondary | ICD-10-CM | POA: Insufficient documentation

## 2021-07-18 DIAGNOSIS — R7989 Other specified abnormal findings of blood chemistry: Secondary | ICD-10-CM | POA: Diagnosis not present

## 2021-07-18 DIAGNOSIS — I700 Atherosclerosis of aorta: Secondary | ICD-10-CM | POA: Diagnosis not present

## 2021-07-18 DIAGNOSIS — M546 Pain in thoracic spine: Secondary | ICD-10-CM

## 2021-07-18 DIAGNOSIS — J40 Bronchitis, not specified as acute or chronic: Secondary | ICD-10-CM

## 2021-07-18 DIAGNOSIS — R051 Acute cough: Secondary | ICD-10-CM

## 2021-07-18 LAB — COMPREHENSIVE METABOLIC PANEL
ALT: 20 U/L (ref 0–44)
AST: 23 U/L (ref 15–41)
Albumin: 3.9 g/dL (ref 3.5–5.0)
Alkaline Phosphatase: 67 U/L (ref 38–126)
Anion gap: 6 (ref 5–15)
BUN: 15 mg/dL (ref 6–20)
CO2: 26 mmol/L (ref 22–32)
Calcium: 9.2 mg/dL (ref 8.9–10.3)
Chloride: 105 mmol/L (ref 98–111)
Creatinine, Ser: 0.54 mg/dL (ref 0.44–1.00)
GFR, Estimated: >60 mL/min (ref >60)
Glucose, Bld: 137 mg/dL — ABNORMAL HIGH (ref 70–99)
Potassium: 3.8 mmol/L (ref 3.5–5.1)
Sodium: 137 mmol/L (ref 135–145)
Total Bilirubin: 0.6 mg/dL (ref 0.3–1.2)
Total Protein: 6.9 g/dL (ref 6.5–8.1)

## 2021-07-18 LAB — URINALYSIS, ROUTINE W REFLEX MICROSCOPIC
Bilirubin Urine: NEGATIVE
Glucose, UA: NEGATIVE mg/dL
Hgb urine dipstick: NEGATIVE
Ketones, ur: NEGATIVE mg/dL
Leukocytes,Ua: NEGATIVE
Nitrite: NEGATIVE
Protein, ur: NEGATIVE mg/dL
Specific Gravity, Urine: >1.03 — ABNORMAL HIGH (ref 1.005–1.030)
pH: 6 (ref 5.0–8.0)

## 2021-07-18 LAB — RESP PANEL BY RT-PCR (FLU A&B, COVID) ARPGX2
Influenza A by PCR: NEGATIVE
Influenza B by PCR: NEGATIVE
SARS Coronavirus 2 by RT PCR: NEGATIVE

## 2021-07-18 LAB — D-DIMER, QUANTITATIVE: D-Dimer, Quant: 0.64 ug/mL-FEU — ABNORMAL HIGH (ref 0.00–0.50)

## 2021-07-18 LAB — CBC
HCT: 37.8 % (ref 36.0–46.0)
Hemoglobin: 13.3 g/dL (ref 12.0–15.0)
MCH: 32 pg (ref 26.0–34.0)
MCHC: 35.2 g/dL (ref 30.0–36.0)
MCV: 91.1 fL (ref 80.0–100.0)
Platelets: 281 10*3/uL (ref 150–400)
RBC: 4.15 MIL/uL (ref 3.87–5.11)
RDW: 12 % (ref 11.5–15.5)
WBC: 13.8 10*3/uL — ABNORMAL HIGH (ref 4.0–10.5)
nRBC: 0 % (ref 0.0–0.2)

## 2021-07-18 LAB — LIPASE, BLOOD: Lipase: 42 U/L (ref 11–51)

## 2021-07-18 MED ORDER — IOHEXOL 350 MG/ML SOLN
75.0000 mL | Freq: Once | INTRAVENOUS | Status: AC | PRN
  Administered 2021-07-18: 75 mL via INTRAVENOUS

## 2021-07-18 MED ORDER — METHOCARBAMOL 500 MG PO TABS: 500.0000 mg | ORAL_TABLET | Freq: Three times a day (TID) | ORAL | 0 refills | Status: AC | PRN

## 2021-07-18 MED ORDER — METHOCARBAMOL 500 MG PO TABS
1000.0000 mg | ORAL_TABLET | Freq: Once | ORAL | Status: AC
  Administered 2021-07-18: 1000 mg via ORAL
  Filled 2021-07-18: qty 2

## 2021-07-18 MED ORDER — METHYLPREDNISOLONE SODIUM SUCC 125 MG IJ SOLR
125.0000 mg | INTRAMUSCULAR | Status: AC
  Administered 2021-07-18: 125 mg via INTRAVENOUS
  Filled 2021-07-18: qty 2

## 2021-07-18 MED ORDER — IPRATROPIUM-ALBUTEROL 0.5-2.5 (3) MG/3ML IN SOLN
3.0000 mL | Freq: Once | RESPIRATORY_TRACT | Status: AC
  Administered 2021-07-18: 3 mL via RESPIRATORY_TRACT
  Filled 2021-07-18: qty 3

## 2021-07-18 MED ORDER — DOXYCYCLINE HYCLATE 100 MG PO CAPS: 100.0000 mg | ORAL_CAPSULE | Freq: Two times a day (BID) | ORAL | 0 refills | Status: AC

## 2021-07-18 NOTE — ED Triage Notes (Signed)
Pt to ED via POV, c/o L flank pain that radiates to her back. Pt states started Friday evening and has progressively worsened. Pt denies urinary changes at this time, denies hx of kidney stones.

## 2021-07-18 NOTE — Discharge Instructions (Addendum)
Your CT chest today showed: FINDINGS: Cardiovascular: There is mild cardiomegaly with left chamber predominance of small chronic pericardial effusion. The pulmonary arteries are normal in caliber without evidence of thromboemboli. There are mild features of aortic atherosclerosis in the distal arch segment. There is no aneurysm or dissection, with normal great vessel branching and enhancement. Pulmonary veins normal caliber.   Mediastinum/Nodes: There few stable mildly prominent bilateral hilar lymph nodes. No masses seen in the inferior poles of the thyroid gland or in the axillary spaces and there is no mediastinal adenopathy. The trachea and central airways are clear.   Lungs/Pleura: There is no pleural effusion, thickening or pneumothorax. There is a stable appearance of mosaic parenchymal attenuation in the mid and lower lung fields.   Scattered subcentimeter noncalcified lung nodules are unchanged   There are no new or enlarging nodules or active infiltrates. Mild posterior atelectasis is also similar.   Upper Abdomen: No acute abnormality. Mild hepatic steatosis. Status post cholecystectomy. Some images may suggest least slight modulation of the anterior liver surface and the patient could have early cirrhosis but there is no splenomegaly. Gallbladder is absent and no biliary dilatation is seen.   Musculoskeletal: No chest wall abnormality. No acute or significant osseous findings.   IMPRESSION: 1. No evidence of arterial embolism. 2. Mild cardiomegaly, small pericardial chronic effusion and no visible CAD. 3. Aortic atherosclerosis. 4. Chronic stable subcentimeter nodules and mosaic lung attenuation changes, the latter consistent with chronic small airways disease. No appreciable change since 2011. 5. Query early hepatic cirrhosis. No ascites. No splenomegaly. Mild hepatic steatosis.  CT of your Abdomen and pelvis showed: FINDINGS: Lower chest: No acute  abnormality.   Hepatobiliary: No focal liver abnormality is seen. Status post cholecystectomy. No biliary dilatation.   Pancreas: Unremarkable. No pancreatic ductal dilatation or surrounding inflammatory changes.   Spleen: Normal in size without focal abnormality.   Adrenals/Urinary Tract: Adrenal glands are within normal limits. No renal calculi or urinary tract obstructive changes are seen. The ureters are within normal limits bilaterally. Bladder is decompressed.   Stomach/Bowel: No obstructive or inflammatory changes of the colon are seen. The appendix is within normal limits. Small bowel and stomach are unremarkable.   Vascular/Lymphatic: Aortic atherosclerosis. No enlarged abdominal or pelvic lymph nodes.   Reproductive: Uterus and bilateral adnexa are unremarkable.   Other: No abdominal wall hernia or abnormality. No abdominopelvic ascites.   Musculoskeletal: No acute or significant osseous findings.   IMPRESSION: No renal calculi or obstructive changes. No acute abnormality noted.

## 2021-07-18 NOTE — ED Provider Notes (Addendum)
[facility] Provider Note    Event Date/Time   First MD Initiated Contact with Patient 07/18/21 [phone]     (approximate)   History   Flank Pain   HPI  Sydney Wilson is a 51 y.o. female with a past medical history of tobacco abuse who presents for assessment of approximately 1 week of productive cough with yellow-white phlegm as well as approximately 3 days of some left-sided mid back pain rating up towards the chest.  Patient states she is not sure if she tore a muscle or not.  She has never had a kidney stone and has not had any burning or blood with urination.  She denies any vomiting, diarrhea, constipation, abdominal pain, chest pain, shortness of breath, fevers, chills, headache, earache, sore throat or any other clear associated sick symptoms.  She states she was prescribed Tessalon Perles about a week ago for her cough these have not helped much.  She is a smoker and uses albuterol as needed at home.  No other acute concerns at this time.  No history of DVT/PE.      Physical Exam  Triage Vital Signs: ED Triage Vitals  Enc Vitals Group     BP 07/18/21 0023 (!) 146/80     Pulse Rate 07/18/21 0023 86     Resp 07/18/21 0023 (!) 22     Temp 07/18/21 0023 97.9 F (36.6 C)     Temp Source 07/18/21 0023 Oral     SpO2 07/18/21 0023 95 %     Weight 07/18/21 0022 179 lb 0.2 oz (81.2 kg)     Height 07/18/21 0022 5\' 2"  (1.575 m)     Head Circumference --      Peak Flow --      Pain Score 07/18/21 0022 10     Pain Loc --      Pain Edu? --      Excl. in [place]? --     Most recent vital signs: Vitals:   07/18/21 0314 07/18/21 0505  BP: (!) 134/112 118/70  Pulse: 80 73  Resp: 18 17  Temp:  98 F (36.7 C)  SpO2: 98% 95%    General: Awake, no distress.  CV:  Good peripheral perfusion.  No murmurs rubs or gallops.  2+ radial pulses. Resp:  Normal effort.  Slight tachypneic with bilateral end expiratory wheezing. Abd:  No distention.  Soft  throughout. Other:  Some mild tenderness over the left mid back region without overlying skin changes and no midline tenderness.   ED Results / Procedures / Treatments  Labs (all labs ordered are listed, but only abnormal results are displayed) Labs Reviewed  URINALYSIS, ROUTINE W REFLEX MICROSCOPIC - Abnormal; Notable for the following components:      Result Value   Color, Urine YELLOW (*)    APPearance CLEAR (*)    Specific Gravity, Urine >1.030 (*)    Bacteria, UA RARE (*)    All other components within normal limits  CBC - Abnormal; Notable for the following components:   WBC 13.8 (*)    All other components within normal limits  COMPREHENSIVE METABOLIC PANEL - Abnormal; Notable for the following components:   Glucose, Bld 137 (*)    All other components within normal limits  D-DIMER, QUANTITATIVE - Abnormal; Notable for the following components:   D-Dimer, Quant 0.64 (*)    All other components within normal limits  RESP PANEL BY RT-PCR (FLU A&B, COVID) ARPGX2  LIPASE, BLOOD     EKG   RADIOLOGY  CTA chest ordered and reviewed by myself shows no evidence of PE mild cardiomegaly and a very small chronic appearing pericardial effusion.  No evidence of focal consolidation, effusion, edema, pneumothorax or other clear acute thoracic process.  There is evidence of some early cirrhosis.  I also reviewed radiology's interpretation and agree with her findings.  The findings include evidence of some chronic stable.  Lung nodules and evidence of small reactive airway disease.  CT abdomen pelvis reviewed by myself shows no evidence of a kidney stone, ureteral dilation, perinephric stranding, diverticulitis or other acute abdominal pelvic process.  There are few aortic atherosclerotic calcifications noted.  PROCEDURES:  Critical Care performed: No  Procedures   MEDICATIONS ORDERED IN ED: Medications  ipratropium-albuterol (DUONEB) 0.5-2.5 (3) MG/3ML nebulizer solution 3 mL (3  mLs Nebulization Given 07/18/21 0315)  methocarbamol (ROBAXIN) tablet 1,000 mg (1,000 mg Oral Given 07/18/21 0319)  iohexol (OMNIPAQUE) 350 MG/ML injection 75 mL (75 mLs Intravenous Contrast Given 07/18/21 0420)  methylPREDNISolone sodium succinate (SOLU-MEDROL) 125 mg/2 mL injection 125 mg (125 mg Intravenous Given 07/18/21 0523)     IMPRESSION / MDM / ASSESSMENT AND PLAN / ED COURSE  I reviewed the triage vital signs and the nursing notes.                              Differential diagnosis includes, but is not limited to, muscle strain versus rib fracture in the setting of possible bronchitis or pneumonia, PE, kidney stone diverticulitis.  D-dimer slightly elevated at 0.64 and a CTA obtained to rule out PE as well as CT abdomen pelvis to rule out kidney stone.  CTA chest ordered and reviewed by myself shows no evidence of PE mild cardiomegaly and a very small chronic appearing pericardial effusion.  No evidence of focal consolidation, effusion, edema, pneumothorax or other clear acute thoracic process.  There is evidence of some early cirrhosis.  I also reviewed radiology's interpretation and agree with her findings.  The findings include evidence of some chronic stable.  Lung nodules and evidence of small reactive airway disease.  CT abdomen pelvis reviewed by myself shows no evidence of a kidney stone, ureteral dilation, perinephric stranding, diverticulitis or other acute abdominal pelvic process.  There are few aortic atherosclerotic calcifications noted.  COVID influenza PCR negative.  CMP shows no significant electrolyte or metabolic derangements.  UA remarkable for leukocytosis with WBC count of 13.8 without evidence of acute anemia.  UA is unremarkable.  No evidence of pericardial effusion being acute or causing any significant hemodynamic instability and likely not contributing to presentation today.  I suspect likely acute bronchitis possibly with a component of undiagnosed COPD  exacerbation given there is some minimal wheezing on arrival.  She does have some mild  tenderness in her back which I suspect is likely musculoskeletal given no evidence of PE, kidney stone or other clear etiology on CTs.  She has been coughing a lot as well which could put her at risk for this.  She was given breathing treatments and steroids in the emergency room and was feeling better on my reassessment.  She also was given muscle relaxant which she states helped quite a bit.  I will write for short course of this as well as a course of doxycycline given his somewhat difficult to exclude atypical pneumonia I think is reasonable given patient's tobacco abuse history and  wheezing..  Emphasized importance of tobacco cessation and recommendation for close outpatient PCP follow-up.  She is stable for discharge with close outpatient follow-up.  Discharged in stable condition.  Discussed findings on CTs including lung nodule and small pericardial effusion which I do not think are related to her presentation today and can be followed up outpatient.  Strict return precautions advised and discussed.     FINAL CLINICAL IMPRESSION(S) / ED DIAGNOSES   Final diagnoses:  Acute left-sided thoracic back pain  Acute cough  Positive D dimer  Tobacco abuse  Bronchitis  Aortic atherosclerosis (HCC)  Lung nodules  Pericardial effusion     Rx / DC Orders   ED Discharge Orders          Ordered    methocarbamol (ROBAXIN) 500 MG tablet  Every 8 hours PRN        07/18/21 0517    doxycycline (VIBRAMYCIN) 100 MG capsule  2 times daily        07/18/21 2811             Note:  This document was prepared using Dragon voice recognition software and may include unintentional dictation errors.   Lucrezia Starch, MD 07/18/21 [id number]    Lucrezia Starch, MD 07/18/21 [phone]

## 2021-11-23 ENCOUNTER — Other Ambulatory Visit: Payer: Self-pay | Admitting: Infectious Diseases

## 2021-11-23 DIAGNOSIS — Z1231 Encounter for screening mammogram for malignant neoplasm of breast: Secondary | ICD-10-CM

## 2021-12-22 ENCOUNTER — Ambulatory Visit
Admission: RE | Admit: 2021-12-22 | Discharge: 2021-12-22 | Disposition: A | Discharge status: Home / Self Care | Payer: BC Managed Care – PPO | Source: Ambulatory Visit | Attending: Infectious Diseases | Admitting: Infectious Diseases

## 2021-12-22 DIAGNOSIS — Z1231 Encounter for screening mammogram for malignant neoplasm of breast: Secondary | ICD-10-CM | POA: Diagnosis not present

## 2021-12-29 LAB — EXTERNAL GENERIC LAB PROCEDURE: COLOGUARD: NEGATIVE

## 2021-12-29 LAB — COLOGUARD: COLOGUARD: NEGATIVE

## 2022-12-31 IMAGING — MG MM DIGITAL SCREENING BILAT W/ TOMO AND CAD
6 of 10 series · 6 of 30 positions shown · non-contrast
Comparison: None available.

CLINICAL DATA: Screening.

EXAM:
DIGITAL SCREENING BILATERAL MAMMOGRAM WITH TOMOSYNTHESIS AND CAD
TECHNIQUE: Bilateral screening digital craniocaudal and mediolateral oblique
mammograms were obtained. Bilateral screening digital breast
tomosynthesis was performed. The images were evaluated with
computer-aided detection.

[R MLO synth-2D (1 of 2)]
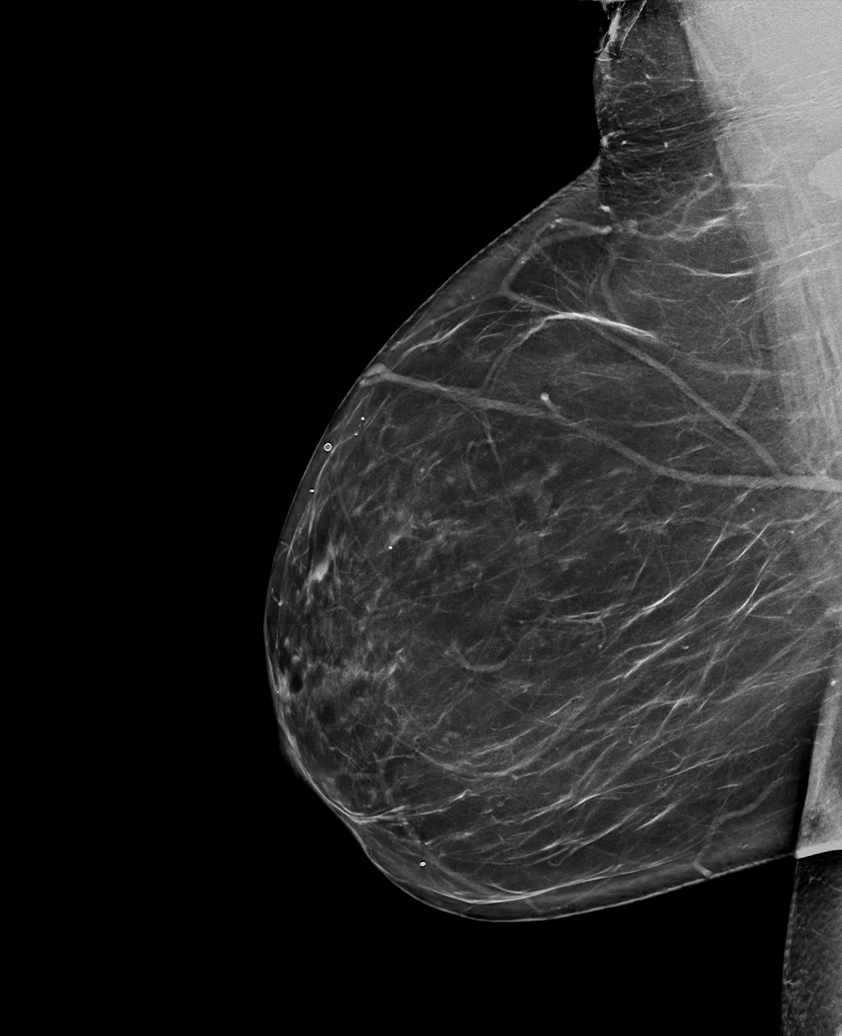

[R MLO synth-2D (2 of 2)]
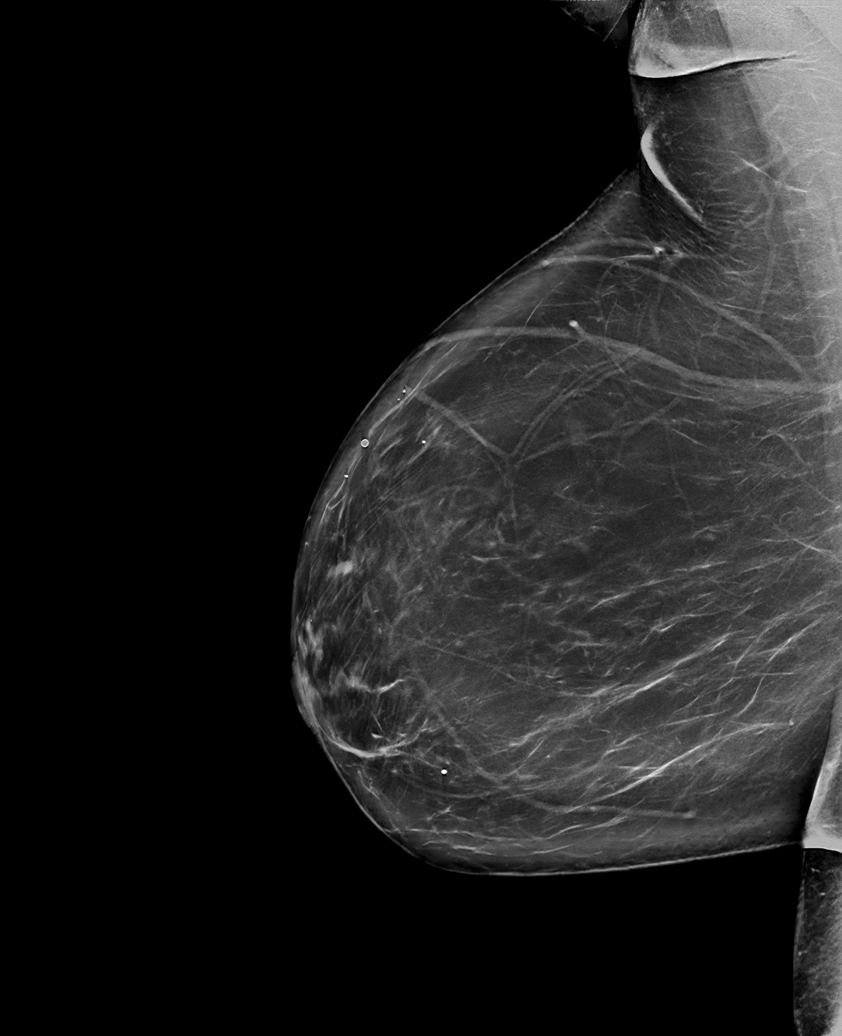

[L CC synth-2D]
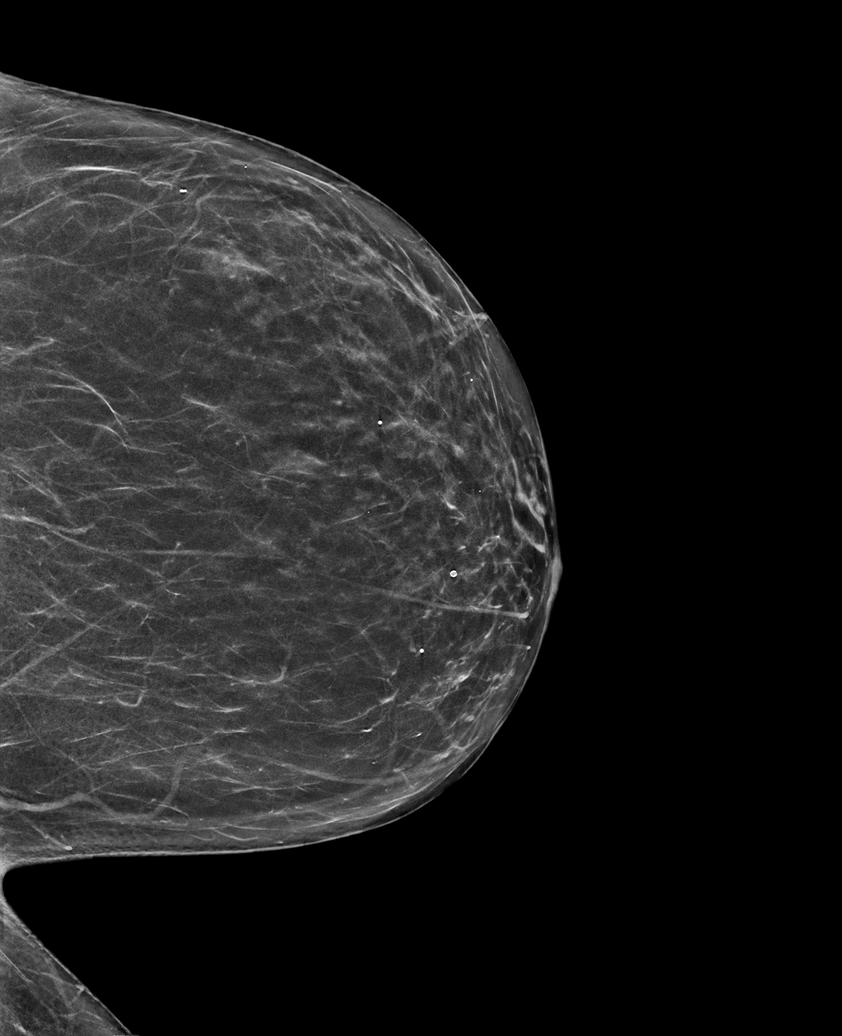

[L MLO synth-2D]
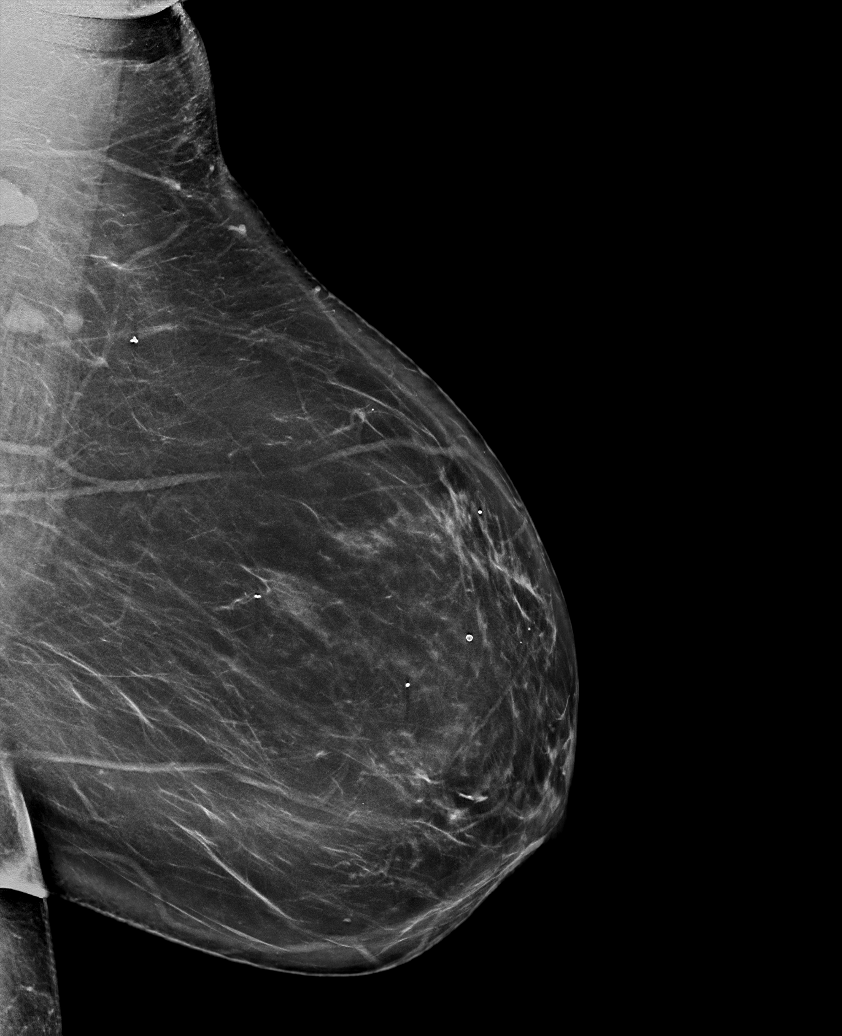

[R CC synth-2D]
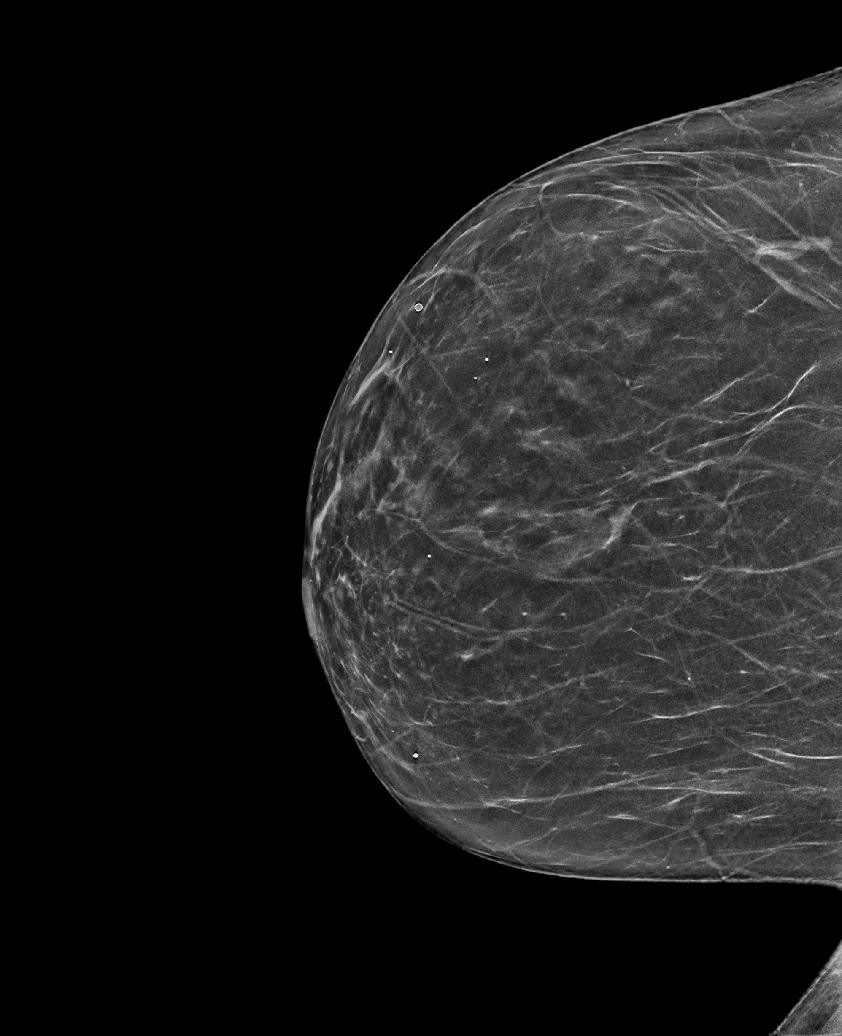

[R MLO tomo · tomo slice 51/101.0]
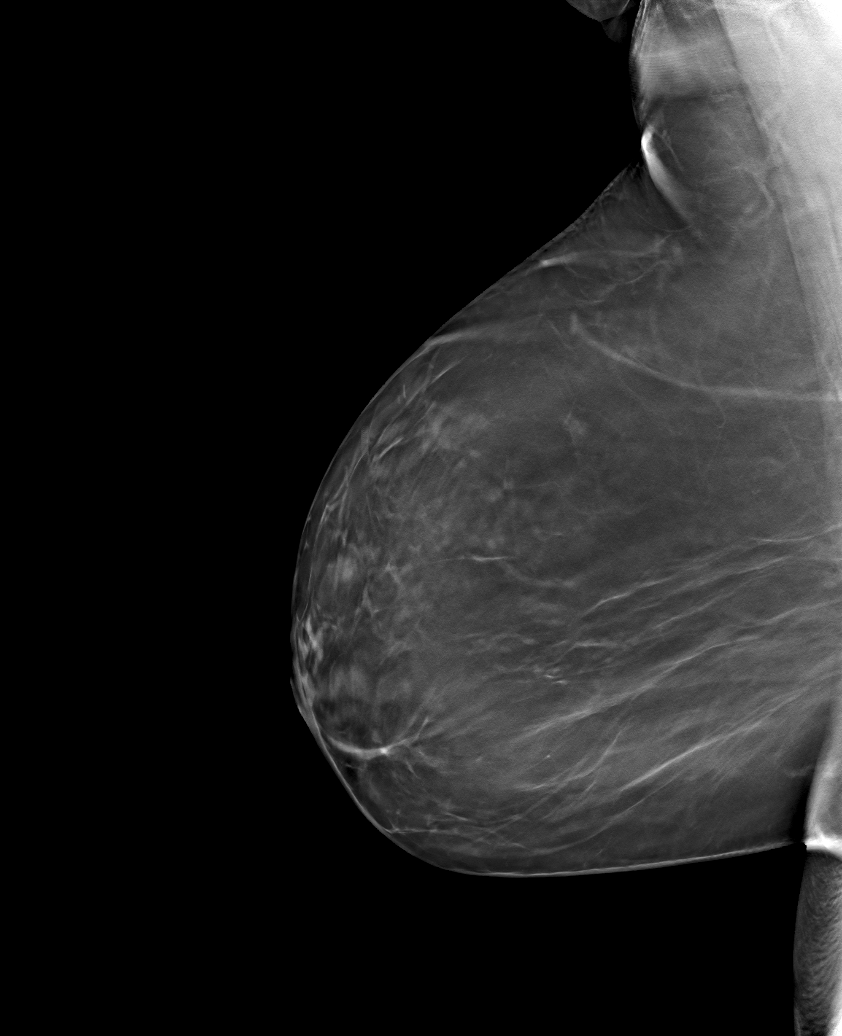

[6 of 30 positions shown; findings below may reference images not displayed]

ACR Breast Density Category b: There are scattered areas of
fibroglandular density.
FINDINGS: There are no findings suspicious for malignancy.
IMPRESSION: No mammographic evidence of malignancy. A result letter of this
screening mammogram will be mailed directly to the patient.

RECOMMENDATION:
Screening mammogram in one year. (Code:GB-Z-FIU)

BI-RADS CATEGORY  1: Negative.

## 2023-05-30 ENCOUNTER — Encounter: Payer: Self-pay | Admitting: *Deleted

## 2023-07-21 ENCOUNTER — Other Ambulatory Visit: Payer: Self-pay | Admitting: Emergency Medicine

## 2023-07-21 ENCOUNTER — Institutional Professional Consult (permissible substitution): Payer: BC Managed Care – PPO | Admitting: Pulmonary Disease

## 2023-07-21 DIAGNOSIS — Z122 Encounter for screening for malignant neoplasm of respiratory organs: Secondary | ICD-10-CM

## 2023-07-21 DIAGNOSIS — F17210 Nicotine dependence, cigarettes, uncomplicated: Secondary | ICD-10-CM

## 2023-09-12 ENCOUNTER — Encounter: Payer: Self-pay | Admitting: Emergency Medicine

## 2023-09-15 ENCOUNTER — Ambulatory Visit
Admission: RE | Admit: 2023-09-15 | Discharge: 2023-09-15 | Disposition: A | Discharge status: Home / Self Care | Source: Ambulatory Visit | Attending: Emergency Medicine | Admitting: Emergency Medicine

## 2023-09-15 DIAGNOSIS — Z122 Encounter for screening for malignant neoplasm of respiratory organs: Secondary | ICD-10-CM

## 2023-09-15 DIAGNOSIS — F17210 Nicotine dependence, cigarettes, uncomplicated: Secondary | ICD-10-CM

## 2023-12-20 ENCOUNTER — Other Ambulatory Visit: Payer: Self-pay | Admitting: Infectious Diseases

## 2023-12-20 DIAGNOSIS — Z1231 Encounter for screening mammogram for malignant neoplasm of breast: Secondary | ICD-10-CM

## 2024-01-02 ENCOUNTER — Ambulatory Visit
Admission: RE | Admit: 2024-01-02 | Discharge: 2024-01-02 | Disposition: A | Discharge status: Home / Self Care | Source: Ambulatory Visit | Attending: Infectious Diseases | Admitting: Infectious Diseases

## 2024-01-02 DIAGNOSIS — Z1231 Encounter for screening mammogram for malignant neoplasm of breast: Secondary | ICD-10-CM | POA: Diagnosis present
